# Patient Record
Sex: Male | Born: 1965
Health system: Southern US, Community
[De-identification: ages and names within clinical notes are randomized; demographics above are authoritative.]

## PROBLEM LIST (undated history)

## (undated) DIAGNOSIS — I10 Essential (primary) hypertension: Secondary | ICD-10-CM

---

## 2010-01-04 ENCOUNTER — Encounter: Admission: RE | Admit: 2010-01-04 | Discharge: 2010-01-04 | Payer: Self-pay | Admitting: Neurosurgery

## 2010-02-06 ENCOUNTER — Ambulatory Visit (HOSPITAL_COMMUNITY): Admission: RE | Admit: 2010-02-06 | Discharge: 2010-02-07 | Payer: Self-pay | Admitting: Neurosurgery

## 2011-01-17 LAB — BASIC METABOLIC PANEL
BUN: 6 mg/dL (ref 6–23)
CO2: 25 mEq/L (ref 19–32)
Calcium: 8.7 mg/dL (ref 8.4–10.5)
Creatinine, Ser: 1.06 mg/dL (ref 0.4–1.5)
GFR calc Af Amer: >60 mL/min (ref >60)
GFR calc non Af Amer: >60 mL/min (ref >60)
Glucose, Bld: 88 mg/dL (ref 70–99)
Sodium: 135 mEq/L (ref 135–145)

## 2011-01-17 LAB — CBC
MCV: 91.3 fL (ref 78.0–100.0)
WBC: 9.5 10*3/uL (ref 4.0–10.5)

## 2015-11-04 DIAGNOSIS — K219 Gastro-esophageal reflux disease without esophagitis: Secondary | ICD-10-CM | POA: Diagnosis not present

## 2015-11-04 DIAGNOSIS — Z77090 Contact with and (suspected) exposure to asbestos: Secondary | ICD-10-CM | POA: Diagnosis not present

## 2015-12-15 DIAGNOSIS — M179 Osteoarthritis of knee, unspecified: Secondary | ICD-10-CM | POA: Diagnosis not present

## 2015-12-15 DIAGNOSIS — F419 Anxiety disorder, unspecified: Secondary | ICD-10-CM | POA: Diagnosis not present

## 2015-12-15 DIAGNOSIS — G894 Chronic pain syndrome: Secondary | ICD-10-CM | POA: Diagnosis not present

## 2015-12-15 DIAGNOSIS — R110 Nausea: Secondary | ICD-10-CM | POA: Diagnosis not present

## 2016-02-15 DIAGNOSIS — G894 Chronic pain syndrome: Secondary | ICD-10-CM | POA: Diagnosis not present

## 2016-03-15 DIAGNOSIS — G894 Chronic pain syndrome: Secondary | ICD-10-CM | POA: Diagnosis not present

## 2016-06-26 DIAGNOSIS — G894 Chronic pain syndrome: Secondary | ICD-10-CM | POA: Diagnosis not present

## 2016-06-26 DIAGNOSIS — F419 Anxiety disorder, unspecified: Secondary | ICD-10-CM | POA: Diagnosis not present

## 2016-07-13 DIAGNOSIS — M1711 Unilateral primary osteoarthritis, right knee: Secondary | ICD-10-CM | POA: Diagnosis not present

## 2016-07-25 DIAGNOSIS — F419 Anxiety disorder, unspecified: Secondary | ICD-10-CM | POA: Diagnosis not present

## 2016-09-16 DIAGNOSIS — Z23 Encounter for immunization: Secondary | ICD-10-CM | POA: Diagnosis not present

## 2016-10-01 DIAGNOSIS — G4700 Insomnia, unspecified: Secondary | ICD-10-CM | POA: Diagnosis not present

## 2016-10-01 DIAGNOSIS — G894 Chronic pain syndrome: Secondary | ICD-10-CM | POA: Diagnosis not present

## 2016-10-01 DIAGNOSIS — F419 Anxiety disorder, unspecified: Secondary | ICD-10-CM | POA: Diagnosis not present

## 2016-10-01 DIAGNOSIS — R5382 Chronic fatigue, unspecified: Secondary | ICD-10-CM | POA: Diagnosis not present

## 2016-11-01 DIAGNOSIS — I973 Postprocedural hypertension: Secondary | ICD-10-CM | POA: Diagnosis not present

## 2016-11-01 DIAGNOSIS — F419 Anxiety disorder, unspecified: Secondary | ICD-10-CM | POA: Diagnosis not present

## 2016-11-01 DIAGNOSIS — G894 Chronic pain syndrome: Secondary | ICD-10-CM | POA: Diagnosis not present

## 2016-11-07 DIAGNOSIS — F4321 Adjustment disorder with depressed mood: Secondary | ICD-10-CM | POA: Diagnosis not present

## 2016-11-07 DIAGNOSIS — H6122 Impacted cerumen, left ear: Secondary | ICD-10-CM | POA: Diagnosis not present

## 2016-11-07 DIAGNOSIS — H6692 Otitis media, unspecified, left ear: Secondary | ICD-10-CM | POA: Diagnosis not present

## 2016-11-07 DIAGNOSIS — R5383 Other fatigue: Secondary | ICD-10-CM | POA: Diagnosis not present

## 2016-11-23 DIAGNOSIS — R52 Pain, unspecified: Secondary | ICD-10-CM | POA: Diagnosis not present

## 2016-11-23 DIAGNOSIS — K029 Dental caries, unspecified: Secondary | ICD-10-CM | POA: Diagnosis not present

## 2017-01-01 DIAGNOSIS — M542 Cervicalgia: Secondary | ICD-10-CM | POA: Diagnosis not present

## 2017-01-01 DIAGNOSIS — E6609 Other obesity due to excess calories: Secondary | ICD-10-CM | POA: Diagnosis not present

## 2017-01-01 DIAGNOSIS — F5101 Primary insomnia: Secondary | ICD-10-CM | POA: Diagnosis not present

## 2017-01-01 DIAGNOSIS — F411 Generalized anxiety disorder: Secondary | ICD-10-CM | POA: Diagnosis not present

## 2017-01-09 DIAGNOSIS — M5416 Radiculopathy, lumbar region: Secondary | ICD-10-CM | POA: Diagnosis not present

## 2017-01-09 DIAGNOSIS — M5412 Radiculopathy, cervical region: Secondary | ICD-10-CM | POA: Diagnosis not present

## 2017-01-17 DIAGNOSIS — M1711 Unilateral primary osteoarthritis, right knee: Secondary | ICD-10-CM | POA: Diagnosis not present

## 2017-01-23 DIAGNOSIS — M1711 Unilateral primary osteoarthritis, right knee: Secondary | ICD-10-CM | POA: Diagnosis not present

## 2017-01-23 DIAGNOSIS — M25561 Pain in right knee: Secondary | ICD-10-CM | POA: Diagnosis not present

## 2017-01-23 DIAGNOSIS — G8929 Other chronic pain: Secondary | ICD-10-CM | POA: Diagnosis not present

## 2017-02-20 DIAGNOSIS — M5416 Radiculopathy, lumbar region: Secondary | ICD-10-CM | POA: Diagnosis not present

## 2017-02-20 DIAGNOSIS — G8929 Other chronic pain: Secondary | ICD-10-CM | POA: Diagnosis not present

## 2017-02-20 DIAGNOSIS — M5412 Radiculopathy, cervical region: Secondary | ICD-10-CM | POA: Diagnosis not present

## 2017-02-20 DIAGNOSIS — M25561 Pain in right knee: Secondary | ICD-10-CM | POA: Diagnosis not present

## 2017-03-13 DIAGNOSIS — M1711 Unilateral primary osteoarthritis, right knee: Secondary | ICD-10-CM | POA: Diagnosis not present

## 2017-03-27 DIAGNOSIS — M5412 Radiculopathy, cervical region: Secondary | ICD-10-CM | POA: Diagnosis not present

## 2017-03-27 DIAGNOSIS — G8929 Other chronic pain: Secondary | ICD-10-CM | POA: Diagnosis not present

## 2017-03-27 DIAGNOSIS — M25561 Pain in right knee: Secondary | ICD-10-CM | POA: Diagnosis not present

## 2017-03-27 DIAGNOSIS — M170 Bilateral primary osteoarthritis of knee: Secondary | ICD-10-CM | POA: Diagnosis not present

## 2017-03-27 DIAGNOSIS — M5416 Radiculopathy, lumbar region: Secondary | ICD-10-CM | POA: Diagnosis not present

## 2017-07-08 DIAGNOSIS — K029 Dental caries, unspecified: Secondary | ICD-10-CM | POA: Diagnosis not present

## 2017-07-08 DIAGNOSIS — R52 Pain, unspecified: Secondary | ICD-10-CM | POA: Diagnosis not present

## 2017-08-24 DIAGNOSIS — Z23 Encounter for immunization: Secondary | ICD-10-CM | POA: Diagnosis not present

## 2017-09-03 DIAGNOSIS — I10 Essential (primary) hypertension: Secondary | ICD-10-CM | POA: Diagnosis not present

## 2017-09-03 DIAGNOSIS — E782 Mixed hyperlipidemia: Secondary | ICD-10-CM | POA: Diagnosis not present

## 2017-09-03 DIAGNOSIS — G8929 Other chronic pain: Secondary | ICD-10-CM | POA: Diagnosis not present

## 2017-09-03 DIAGNOSIS — J45998 Other asthma: Secondary | ICD-10-CM | POA: Diagnosis not present

## 2017-09-03 DIAGNOSIS — M542 Cervicalgia: Secondary | ICD-10-CM | POA: Diagnosis not present

## 2017-09-03 DIAGNOSIS — M25561 Pain in right knee: Secondary | ICD-10-CM | POA: Diagnosis not present

## 2017-09-03 DIAGNOSIS — E291 Testicular hypofunction: Secondary | ICD-10-CM | POA: Diagnosis not present

## 2017-09-03 DIAGNOSIS — G43819 Other migraine, intractable, without status migrainosus: Secondary | ICD-10-CM | POA: Diagnosis not present

## 2017-09-24 DIAGNOSIS — M25561 Pain in right knee: Secondary | ICD-10-CM | POA: Diagnosis not present

## 2017-09-24 DIAGNOSIS — I10 Essential (primary) hypertension: Secondary | ICD-10-CM | POA: Diagnosis not present

## 2017-10-25 DIAGNOSIS — Z96651 Presence of right artificial knee joint: Secondary | ICD-10-CM | POA: Diagnosis not present

## 2017-10-25 DIAGNOSIS — I10 Essential (primary) hypertension: Secondary | ICD-10-CM | POA: Diagnosis not present

## 2017-10-25 DIAGNOSIS — S39012A Strain of muscle, fascia and tendon of lower back, initial encounter: Secondary | ICD-10-CM | POA: Diagnosis not present

## 2017-10-25 DIAGNOSIS — G8929 Other chronic pain: Secondary | ICD-10-CM | POA: Diagnosis not present

## 2017-11-01 DIAGNOSIS — M545 Low back pain: Secondary | ICD-10-CM | POA: Diagnosis not present

## 2017-11-14 DIAGNOSIS — M1711 Unilateral primary osteoarthritis, right knee: Secondary | ICD-10-CM | POA: Diagnosis not present

## 2017-11-26 DIAGNOSIS — M79671 Pain in right foot: Secondary | ICD-10-CM | POA: Diagnosis not present

## 2017-11-26 DIAGNOSIS — K029 Dental caries, unspecified: Secondary | ICD-10-CM | POA: Diagnosis not present

## 2017-11-26 DIAGNOSIS — M25774 Osteophyte, right foot: Secondary | ICD-10-CM | POA: Diagnosis not present

## 2017-12-02 DIAGNOSIS — M25561 Pain in right knee: Secondary | ICD-10-CM | POA: Diagnosis not present

## 2017-12-02 DIAGNOSIS — M722 Plantar fascial fibromatosis: Secondary | ICD-10-CM | POA: Diagnosis not present

## 2017-12-23 DIAGNOSIS — M25561 Pain in right knee: Secondary | ICD-10-CM | POA: Diagnosis not present

## 2017-12-26 DIAGNOSIS — M25561 Pain in right knee: Secondary | ICD-10-CM | POA: Diagnosis not present

## 2018-01-22 DIAGNOSIS — R710 Precipitous drop in hematocrit: Secondary | ICD-10-CM | POA: Diagnosis not present

## 2018-01-22 DIAGNOSIS — G43009 Migraine without aura, not intractable, without status migrainosus: Secondary | ICD-10-CM | POA: Diagnosis not present

## 2018-01-22 DIAGNOSIS — R7301 Impaired fasting glucose: Secondary | ICD-10-CM | POA: Diagnosis not present

## 2018-01-22 DIAGNOSIS — M25561 Pain in right knee: Secondary | ICD-10-CM | POA: Diagnosis not present

## 2018-01-22 DIAGNOSIS — I10 Essential (primary) hypertension: Secondary | ICD-10-CM | POA: Diagnosis not present

## 2018-01-22 DIAGNOSIS — Z1211 Encounter for screening for malignant neoplasm of colon: Secondary | ICD-10-CM | POA: Diagnosis not present

## 2018-01-22 DIAGNOSIS — E782 Mixed hyperlipidemia: Secondary | ICD-10-CM | POA: Diagnosis not present

## 2018-01-27 DIAGNOSIS — M6751 Plica syndrome, right knee: Secondary | ICD-10-CM | POA: Diagnosis not present

## 2018-01-27 DIAGNOSIS — M722 Plantar fascial fibromatosis: Secondary | ICD-10-CM | POA: Diagnosis not present

## 2018-01-27 DIAGNOSIS — M23221 Derangement of posterior horn of medial meniscus due to old tear or injury, right knee: Secondary | ICD-10-CM | POA: Diagnosis not present

## 2018-01-27 DIAGNOSIS — M94261 Chondromalacia, right knee: Secondary | ICD-10-CM | POA: Diagnosis not present

## 2018-01-27 DIAGNOSIS — G8918 Other acute postprocedural pain: Secondary | ICD-10-CM | POA: Diagnosis not present

## 2018-02-13 DIAGNOSIS — M25461 Effusion, right knee: Secondary | ICD-10-CM | POA: Diagnosis not present

## 2018-02-27 ENCOUNTER — Encounter: Payer: Self-pay | Admitting: Medical

## 2018-03-06 DIAGNOSIS — M722 Plantar fascial fibromatosis: Secondary | ICD-10-CM | POA: Diagnosis not present

## 2018-04-22 DIAGNOSIS — M1711 Unilateral primary osteoarthritis, right knee: Secondary | ICD-10-CM | POA: Diagnosis not present

## 2018-05-17 DIAGNOSIS — R001 Bradycardia, unspecified: Secondary | ICD-10-CM | POA: Diagnosis not present

## 2018-05-17 DIAGNOSIS — H5789 Other specified disorders of eye and adnexa: Secondary | ICD-10-CM | POA: Diagnosis not present

## 2018-05-17 DIAGNOSIS — H547 Unspecified visual loss: Secondary | ICD-10-CM | POA: Diagnosis not present

## 2018-05-17 DIAGNOSIS — I10 Essential (primary) hypertension: Secondary | ICD-10-CM | POA: Diagnosis not present

## 2018-05-17 DIAGNOSIS — Z79899 Other long term (current) drug therapy: Secondary | ICD-10-CM | POA: Diagnosis not present

## 2018-05-17 DIAGNOSIS — R200 Anesthesia of skin: Secondary | ICD-10-CM | POA: Diagnosis not present

## 2018-05-17 DIAGNOSIS — T675XXA Heat exhaustion, unspecified, initial encounter: Secondary | ICD-10-CM | POA: Diagnosis not present

## 2018-05-17 DIAGNOSIS — R42 Dizziness and giddiness: Secondary | ICD-10-CM | POA: Diagnosis not present

## 2018-05-17 DIAGNOSIS — R51 Headache: Secondary | ICD-10-CM | POA: Diagnosis not present

## 2018-05-19 DIAGNOSIS — I10 Essential (primary) hypertension: Secondary | ICD-10-CM | POA: Diagnosis not present

## 2018-05-19 DIAGNOSIS — G43009 Migraine without aura, not intractable, without status migrainosus: Secondary | ICD-10-CM | POA: Diagnosis not present

## 2018-06-11 DIAGNOSIS — I10 Essential (primary) hypertension: Secondary | ICD-10-CM | POA: Diagnosis not present

## 2018-06-11 DIAGNOSIS — K0889 Other specified disorders of teeth and supporting structures: Secondary | ICD-10-CM | POA: Diagnosis not present

## 2018-07-25 DIAGNOSIS — G43009 Migraine without aura, not intractable, without status migrainosus: Secondary | ICD-10-CM | POA: Diagnosis not present

## 2018-07-25 DIAGNOSIS — E782 Mixed hyperlipidemia: Secondary | ICD-10-CM | POA: Diagnosis not present

## 2018-07-25 DIAGNOSIS — Z23 Encounter for immunization: Secondary | ICD-10-CM | POA: Diagnosis not present

## 2018-07-25 DIAGNOSIS — Z5181 Encounter for therapeutic drug level monitoring: Secondary | ICD-10-CM | POA: Diagnosis not present

## 2018-07-25 DIAGNOSIS — I10 Essential (primary) hypertension: Secondary | ICD-10-CM | POA: Diagnosis not present

## 2018-07-25 DIAGNOSIS — R7303 Prediabetes: Secondary | ICD-10-CM | POA: Diagnosis not present

## 2018-07-25 DIAGNOSIS — Z1211 Encounter for screening for malignant neoplasm of colon: Secondary | ICD-10-CM | POA: Diagnosis not present

## 2018-08-21 DIAGNOSIS — G43909 Migraine, unspecified, not intractable, without status migrainosus: Secondary | ICD-10-CM | POA: Diagnosis not present

## 2018-08-21 DIAGNOSIS — R5382 Chronic fatigue, unspecified: Secondary | ICD-10-CM | POA: Diagnosis not present

## 2018-08-21 DIAGNOSIS — Z1339 Encounter for screening examination for other mental health and behavioral disorders: Secondary | ICD-10-CM | POA: Diagnosis not present

## 2018-08-21 DIAGNOSIS — I10 Essential (primary) hypertension: Secondary | ICD-10-CM | POA: Diagnosis not present

## 2018-08-21 DIAGNOSIS — M159 Polyosteoarthritis, unspecified: Secondary | ICD-10-CM | POA: Diagnosis not present

## 2018-08-21 DIAGNOSIS — E785 Hyperlipidemia, unspecified: Secondary | ICD-10-CM | POA: Diagnosis not present

## 2018-09-05 DIAGNOSIS — I10 Essential (primary) hypertension: Secondary | ICD-10-CM | POA: Diagnosis not present

## 2018-09-05 DIAGNOSIS — M159 Polyosteoarthritis, unspecified: Secondary | ICD-10-CM | POA: Diagnosis not present

## 2018-09-05 DIAGNOSIS — E785 Hyperlipidemia, unspecified: Secondary | ICD-10-CM | POA: Diagnosis not present

## 2018-09-05 DIAGNOSIS — K047 Periapical abscess without sinus: Secondary | ICD-10-CM | POA: Diagnosis not present

## 2018-09-05 DIAGNOSIS — Z1331 Encounter for screening for depression: Secondary | ICD-10-CM | POA: Diagnosis not present

## 2018-09-18 ENCOUNTER — Encounter: Payer: Self-pay | Admitting: Medical

## 2018-09-19 DIAGNOSIS — E785 Hyperlipidemia, unspecified: Secondary | ICD-10-CM | POA: Diagnosis not present

## 2018-09-19 DIAGNOSIS — M159 Polyosteoarthritis, unspecified: Secondary | ICD-10-CM | POA: Diagnosis not present

## 2018-09-19 DIAGNOSIS — I10 Essential (primary) hypertension: Secondary | ICD-10-CM | POA: Diagnosis not present

## 2018-09-19 DIAGNOSIS — G43909 Migraine, unspecified, not intractable, without status migrainosus: Secondary | ICD-10-CM | POA: Diagnosis not present

## 2018-10-24 DIAGNOSIS — J0100 Acute maxillary sinusitis, unspecified: Secondary | ICD-10-CM | POA: Diagnosis not present

## 2018-10-24 DIAGNOSIS — I10 Essential (primary) hypertension: Secondary | ICD-10-CM | POA: Diagnosis not present

## 2018-10-24 DIAGNOSIS — E785 Hyperlipidemia, unspecified: Secondary | ICD-10-CM | POA: Diagnosis not present

## 2018-10-24 DIAGNOSIS — M159 Polyosteoarthritis, unspecified: Secondary | ICD-10-CM | POA: Diagnosis not present

## 2018-11-24 DIAGNOSIS — G43909 Migraine, unspecified, not intractable, without status migrainosus: Secondary | ICD-10-CM | POA: Diagnosis not present

## 2018-11-24 DIAGNOSIS — Z6841 Body Mass Index (BMI) 40.0 and over, adult: Secondary | ICD-10-CM | POA: Diagnosis not present

## 2018-11-24 DIAGNOSIS — E785 Hyperlipidemia, unspecified: Secondary | ICD-10-CM | POA: Diagnosis not present

## 2018-11-24 DIAGNOSIS — K649 Unspecified hemorrhoids: Secondary | ICD-10-CM | POA: Diagnosis not present

## 2018-11-24 DIAGNOSIS — M159 Polyosteoarthritis, unspecified: Secondary | ICD-10-CM | POA: Diagnosis not present

## 2018-11-24 DIAGNOSIS — F5104 Psychophysiologic insomnia: Secondary | ICD-10-CM | POA: Diagnosis not present

## 2018-11-24 DIAGNOSIS — I10 Essential (primary) hypertension: Secondary | ICD-10-CM | POA: Diagnosis not present

## 2018-12-25 DIAGNOSIS — K029 Dental caries, unspecified: Secondary | ICD-10-CM | POA: Diagnosis not present

## 2018-12-25 DIAGNOSIS — K649 Unspecified hemorrhoids: Secondary | ICD-10-CM | POA: Diagnosis not present

## 2018-12-25 DIAGNOSIS — M159 Polyosteoarthritis, unspecified: Secondary | ICD-10-CM | POA: Diagnosis not present

## 2018-12-25 DIAGNOSIS — E669 Obesity, unspecified: Secondary | ICD-10-CM | POA: Diagnosis not present

## 2018-12-25 DIAGNOSIS — Z6841 Body Mass Index (BMI) 40.0 and over, adult: Secondary | ICD-10-CM | POA: Diagnosis not present

## 2018-12-25 DIAGNOSIS — I10 Essential (primary) hypertension: Secondary | ICD-10-CM | POA: Diagnosis not present

## 2018-12-25 DIAGNOSIS — G43909 Migraine, unspecified, not intractable, without status migrainosus: Secondary | ICD-10-CM | POA: Diagnosis not present

## 2018-12-25 DIAGNOSIS — E785 Hyperlipidemia, unspecified: Secondary | ICD-10-CM | POA: Diagnosis not present

## 2018-12-25 DIAGNOSIS — F5104 Psychophysiologic insomnia: Secondary | ICD-10-CM | POA: Diagnosis not present

## 2018-12-25 DIAGNOSIS — Z1211 Encounter for screening for malignant neoplasm of colon: Secondary | ICD-10-CM | POA: Diagnosis not present

## 2019-01-27 DIAGNOSIS — Z6830 Body mass index (BMI) 30.0-30.9, adult: Secondary | ICD-10-CM | POA: Diagnosis not present

## 2019-01-27 DIAGNOSIS — E785 Hyperlipidemia, unspecified: Secondary | ICD-10-CM | POA: Diagnosis not present

## 2019-01-27 DIAGNOSIS — M159 Polyosteoarthritis, unspecified: Secondary | ICD-10-CM | POA: Diagnosis not present

## 2019-01-27 DIAGNOSIS — K029 Dental caries, unspecified: Secondary | ICD-10-CM | POA: Diagnosis not present

## 2019-01-27 DIAGNOSIS — G43909 Migraine, unspecified, not intractable, without status migrainosus: Secondary | ICD-10-CM | POA: Diagnosis not present

## 2019-01-27 DIAGNOSIS — E669 Obesity, unspecified: Secondary | ICD-10-CM | POA: Diagnosis not present

## 2019-01-27 DIAGNOSIS — K649 Unspecified hemorrhoids: Secondary | ICD-10-CM | POA: Diagnosis not present

## 2019-01-27 DIAGNOSIS — I10 Essential (primary) hypertension: Secondary | ICD-10-CM | POA: Diagnosis not present

## 2019-01-27 DIAGNOSIS — F5104 Psychophysiologic insomnia: Secondary | ICD-10-CM | POA: Diagnosis not present

## 2019-02-26 DIAGNOSIS — J309 Allergic rhinitis, unspecified: Secondary | ICD-10-CM | POA: Diagnosis not present

## 2019-02-26 DIAGNOSIS — F5104 Psychophysiologic insomnia: Secondary | ICD-10-CM | POA: Diagnosis not present

## 2019-02-26 DIAGNOSIS — E669 Obesity, unspecified: Secondary | ICD-10-CM | POA: Diagnosis not present

## 2019-02-26 DIAGNOSIS — G43909 Migraine, unspecified, not intractable, without status migrainosus: Secondary | ICD-10-CM | POA: Diagnosis not present

## 2019-02-26 DIAGNOSIS — K649 Unspecified hemorrhoids: Secondary | ICD-10-CM | POA: Diagnosis not present

## 2019-02-26 DIAGNOSIS — I10 Essential (primary) hypertension: Secondary | ICD-10-CM | POA: Diagnosis not present

## 2019-02-26 DIAGNOSIS — E785 Hyperlipidemia, unspecified: Secondary | ICD-10-CM | POA: Diagnosis not present

## 2019-02-26 DIAGNOSIS — Z6830 Body mass index (BMI) 30.0-30.9, adult: Secondary | ICD-10-CM | POA: Diagnosis not present

## 2019-02-26 DIAGNOSIS — M159 Polyosteoarthritis, unspecified: Secondary | ICD-10-CM | POA: Diagnosis not present

## 2019-03-26 DIAGNOSIS — E785 Hyperlipidemia, unspecified: Secondary | ICD-10-CM | POA: Diagnosis not present

## 2019-03-26 DIAGNOSIS — Z6830 Body mass index (BMI) 30.0-30.9, adult: Secondary | ICD-10-CM | POA: Diagnosis not present

## 2019-03-26 DIAGNOSIS — F5104 Psychophysiologic insomnia: Secondary | ICD-10-CM | POA: Diagnosis not present

## 2019-03-26 DIAGNOSIS — K649 Unspecified hemorrhoids: Secondary | ICD-10-CM | POA: Diagnosis not present

## 2019-03-26 DIAGNOSIS — E669 Obesity, unspecified: Secondary | ICD-10-CM | POA: Diagnosis not present

## 2019-03-26 DIAGNOSIS — I10 Essential (primary) hypertension: Secondary | ICD-10-CM | POA: Diagnosis not present

## 2019-03-26 DIAGNOSIS — J309 Allergic rhinitis, unspecified: Secondary | ICD-10-CM | POA: Diagnosis not present

## 2019-03-26 DIAGNOSIS — G43909 Migraine, unspecified, not intractable, without status migrainosus: Secondary | ICD-10-CM | POA: Diagnosis not present

## 2019-03-26 DIAGNOSIS — M159 Polyosteoarthritis, unspecified: Secondary | ICD-10-CM | POA: Diagnosis not present

## 2019-04-20 DIAGNOSIS — E669 Obesity, unspecified: Secondary | ICD-10-CM | POA: Diagnosis not present

## 2019-04-20 DIAGNOSIS — Z6830 Body mass index (BMI) 30.0-30.9, adult: Secondary | ICD-10-CM | POA: Diagnosis not present

## 2019-04-20 DIAGNOSIS — I10 Essential (primary) hypertension: Secondary | ICD-10-CM | POA: Diagnosis not present

## 2019-04-20 DIAGNOSIS — K649 Unspecified hemorrhoids: Secondary | ICD-10-CM | POA: Diagnosis not present

## 2019-04-20 DIAGNOSIS — M159 Polyosteoarthritis, unspecified: Secondary | ICD-10-CM | POA: Diagnosis not present

## 2019-04-20 DIAGNOSIS — E785 Hyperlipidemia, unspecified: Secondary | ICD-10-CM | POA: Diagnosis not present

## 2019-04-20 DIAGNOSIS — J309 Allergic rhinitis, unspecified: Secondary | ICD-10-CM | POA: Diagnosis not present

## 2019-04-20 DIAGNOSIS — K047 Periapical abscess without sinus: Secondary | ICD-10-CM | POA: Diagnosis not present

## 2019-04-20 DIAGNOSIS — G43909 Migraine, unspecified, not intractable, without status migrainosus: Secondary | ICD-10-CM | POA: Diagnosis not present

## 2019-04-20 DIAGNOSIS — F5104 Psychophysiologic insomnia: Secondary | ICD-10-CM | POA: Diagnosis not present

## 2019-04-24 DIAGNOSIS — G43909 Migraine, unspecified, not intractable, without status migrainosus: Secondary | ICD-10-CM | POA: Diagnosis not present

## 2019-04-24 DIAGNOSIS — M159 Polyosteoarthritis, unspecified: Secondary | ICD-10-CM | POA: Diagnosis not present

## 2019-04-24 DIAGNOSIS — K649 Unspecified hemorrhoids: Secondary | ICD-10-CM | POA: Diagnosis not present

## 2019-04-24 DIAGNOSIS — F5104 Psychophysiologic insomnia: Secondary | ICD-10-CM | POA: Diagnosis not present

## 2019-04-24 DIAGNOSIS — Z6829 Body mass index (BMI) 29.0-29.9, adult: Secondary | ICD-10-CM | POA: Diagnosis not present

## 2019-04-24 DIAGNOSIS — E663 Overweight: Secondary | ICD-10-CM | POA: Diagnosis not present

## 2019-04-24 DIAGNOSIS — L309 Dermatitis, unspecified: Secondary | ICD-10-CM | POA: Diagnosis not present

## 2019-04-24 DIAGNOSIS — J309 Allergic rhinitis, unspecified: Secondary | ICD-10-CM | POA: Diagnosis not present

## 2019-05-04 DIAGNOSIS — F5104 Psychophysiologic insomnia: Secondary | ICD-10-CM | POA: Diagnosis not present

## 2019-05-04 DIAGNOSIS — K649 Unspecified hemorrhoids: Secondary | ICD-10-CM | POA: Diagnosis not present

## 2019-05-04 DIAGNOSIS — M159 Polyosteoarthritis, unspecified: Secondary | ICD-10-CM | POA: Diagnosis not present

## 2019-05-04 DIAGNOSIS — E785 Hyperlipidemia, unspecified: Secondary | ICD-10-CM | POA: Diagnosis not present

## 2019-05-04 DIAGNOSIS — I10 Essential (primary) hypertension: Secondary | ICD-10-CM | POA: Diagnosis not present

## 2019-05-04 DIAGNOSIS — Z6830 Body mass index (BMI) 30.0-30.9, adult: Secondary | ICD-10-CM | POA: Diagnosis not present

## 2019-05-04 DIAGNOSIS — J309 Allergic rhinitis, unspecified: Secondary | ICD-10-CM | POA: Diagnosis not present

## 2019-05-04 DIAGNOSIS — G43909 Migraine, unspecified, not intractable, without status migrainosus: Secondary | ICD-10-CM | POA: Diagnosis not present

## 2019-05-04 DIAGNOSIS — E669 Obesity, unspecified: Secondary | ICD-10-CM | POA: Diagnosis not present

## 2019-05-25 DIAGNOSIS — E663 Overweight: Secondary | ICD-10-CM | POA: Diagnosis not present

## 2019-05-25 DIAGNOSIS — G43909 Migraine, unspecified, not intractable, without status migrainosus: Secondary | ICD-10-CM | POA: Diagnosis not present

## 2019-05-25 DIAGNOSIS — F5104 Psychophysiologic insomnia: Secondary | ICD-10-CM | POA: Diagnosis not present

## 2019-05-25 DIAGNOSIS — K649 Unspecified hemorrhoids: Secondary | ICD-10-CM | POA: Diagnosis not present

## 2019-05-25 DIAGNOSIS — L309 Dermatitis, unspecified: Secondary | ICD-10-CM | POA: Diagnosis not present

## 2019-05-25 DIAGNOSIS — M159 Polyosteoarthritis, unspecified: Secondary | ICD-10-CM | POA: Diagnosis not present

## 2019-05-25 DIAGNOSIS — J309 Allergic rhinitis, unspecified: Secondary | ICD-10-CM | POA: Diagnosis not present

## 2019-06-22 DIAGNOSIS — E669 Obesity, unspecified: Secondary | ICD-10-CM | POA: Diagnosis not present

## 2019-06-22 DIAGNOSIS — G43909 Migraine, unspecified, not intractable, without status migrainosus: Secondary | ICD-10-CM | POA: Diagnosis not present

## 2019-06-22 DIAGNOSIS — F5104 Psychophysiologic insomnia: Secondary | ICD-10-CM | POA: Diagnosis not present

## 2019-06-22 DIAGNOSIS — Z6823 Body mass index (BMI) 23.0-23.9, adult: Secondary | ICD-10-CM | POA: Diagnosis not present

## 2019-06-22 DIAGNOSIS — I10 Essential (primary) hypertension: Secondary | ICD-10-CM | POA: Diagnosis not present

## 2019-06-22 DIAGNOSIS — E785 Hyperlipidemia, unspecified: Secondary | ICD-10-CM | POA: Diagnosis not present

## 2019-06-22 DIAGNOSIS — M159 Polyosteoarthritis, unspecified: Secondary | ICD-10-CM | POA: Diagnosis not present

## 2019-06-22 DIAGNOSIS — E663 Overweight: Secondary | ICD-10-CM | POA: Diagnosis not present

## 2019-06-22 DIAGNOSIS — K649 Unspecified hemorrhoids: Secondary | ICD-10-CM | POA: Diagnosis not present

## 2019-06-22 DIAGNOSIS — L309 Dermatitis, unspecified: Secondary | ICD-10-CM | POA: Diagnosis not present

## 2019-06-22 DIAGNOSIS — J309 Allergic rhinitis, unspecified: Secondary | ICD-10-CM | POA: Diagnosis not present

## 2019-07-25 DIAGNOSIS — Z6830 Body mass index (BMI) 30.0-30.9, adult: Secondary | ICD-10-CM | POA: Diagnosis not present

## 2019-07-25 DIAGNOSIS — K219 Gastro-esophageal reflux disease without esophagitis: Secondary | ICD-10-CM | POA: Diagnosis not present

## 2019-07-25 DIAGNOSIS — Z23 Encounter for immunization: Secondary | ICD-10-CM | POA: Diagnosis not present

## 2019-07-25 DIAGNOSIS — F5104 Psychophysiologic insomnia: Secondary | ICD-10-CM | POA: Diagnosis not present

## 2019-07-25 DIAGNOSIS — E669 Obesity, unspecified: Secondary | ICD-10-CM | POA: Diagnosis not present

## 2019-07-25 DIAGNOSIS — K649 Unspecified hemorrhoids: Secondary | ICD-10-CM | POA: Diagnosis not present

## 2019-07-25 DIAGNOSIS — J309 Allergic rhinitis, unspecified: Secondary | ICD-10-CM | POA: Diagnosis not present

## 2019-07-25 DIAGNOSIS — E785 Hyperlipidemia, unspecified: Secondary | ICD-10-CM | POA: Diagnosis not present

## 2019-07-25 DIAGNOSIS — I10 Essential (primary) hypertension: Secondary | ICD-10-CM | POA: Diagnosis not present

## 2019-07-25 DIAGNOSIS — G43909 Migraine, unspecified, not intractable, without status migrainosus: Secondary | ICD-10-CM | POA: Diagnosis not present

## 2019-07-25 DIAGNOSIS — L309 Dermatitis, unspecified: Secondary | ICD-10-CM | POA: Diagnosis not present

## 2019-07-25 DIAGNOSIS — M159 Polyosteoarthritis, unspecified: Secondary | ICD-10-CM | POA: Diagnosis not present

## 2019-08-22 DIAGNOSIS — F5104 Psychophysiologic insomnia: Secondary | ICD-10-CM | POA: Diagnosis not present

## 2019-08-22 DIAGNOSIS — R739 Hyperglycemia, unspecified: Secondary | ICD-10-CM | POA: Diagnosis not present

## 2019-08-22 DIAGNOSIS — Z6830 Body mass index (BMI) 30.0-30.9, adult: Secondary | ICD-10-CM | POA: Diagnosis not present

## 2019-08-22 DIAGNOSIS — J309 Allergic rhinitis, unspecified: Secondary | ICD-10-CM | POA: Diagnosis not present

## 2019-08-22 DIAGNOSIS — G43909 Migraine, unspecified, not intractable, without status migrainosus: Secondary | ICD-10-CM | POA: Diagnosis not present

## 2019-08-22 DIAGNOSIS — K649 Unspecified hemorrhoids: Secondary | ICD-10-CM | POA: Diagnosis not present

## 2019-08-22 DIAGNOSIS — M159 Polyosteoarthritis, unspecified: Secondary | ICD-10-CM | POA: Diagnosis not present

## 2019-08-22 DIAGNOSIS — E669 Obesity, unspecified: Secondary | ICD-10-CM | POA: Diagnosis not present

## 2019-08-22 DIAGNOSIS — L309 Dermatitis, unspecified: Secondary | ICD-10-CM | POA: Diagnosis not present

## 2019-09-19 DIAGNOSIS — G43909 Migraine, unspecified, not intractable, without status migrainosus: Secondary | ICD-10-CM | POA: Diagnosis not present

## 2019-09-19 DIAGNOSIS — M159 Polyosteoarthritis, unspecified: Secondary | ICD-10-CM | POA: Diagnosis not present

## 2019-09-19 DIAGNOSIS — K219 Gastro-esophageal reflux disease without esophagitis: Secondary | ICD-10-CM | POA: Diagnosis not present

## 2019-09-19 DIAGNOSIS — F5104 Psychophysiologic insomnia: Secondary | ICD-10-CM | POA: Diagnosis not present

## 2019-09-19 DIAGNOSIS — L309 Dermatitis, unspecified: Secondary | ICD-10-CM | POA: Diagnosis not present

## 2019-09-19 DIAGNOSIS — E669 Obesity, unspecified: Secondary | ICD-10-CM | POA: Diagnosis not present

## 2019-09-19 DIAGNOSIS — Z6830 Body mass index (BMI) 30.0-30.9, adult: Secondary | ICD-10-CM | POA: Diagnosis not present

## 2019-09-19 DIAGNOSIS — J309 Allergic rhinitis, unspecified: Secondary | ICD-10-CM | POA: Diagnosis not present

## 2019-09-19 DIAGNOSIS — K649 Unspecified hemorrhoids: Secondary | ICD-10-CM | POA: Diagnosis not present

## 2019-10-17 DIAGNOSIS — F5104 Psychophysiologic insomnia: Secondary | ICD-10-CM | POA: Diagnosis not present

## 2019-10-17 DIAGNOSIS — M25512 Pain in left shoulder: Secondary | ICD-10-CM | POA: Diagnosis not present

## 2019-10-17 DIAGNOSIS — E669 Obesity, unspecified: Secondary | ICD-10-CM | POA: Diagnosis not present

## 2019-10-17 DIAGNOSIS — Z6830 Body mass index (BMI) 30.0-30.9, adult: Secondary | ICD-10-CM | POA: Diagnosis not present

## 2019-10-17 DIAGNOSIS — G43909 Migraine, unspecified, not intractable, without status migrainosus: Secondary | ICD-10-CM | POA: Diagnosis not present

## 2019-10-17 DIAGNOSIS — K649 Unspecified hemorrhoids: Secondary | ICD-10-CM | POA: Diagnosis not present

## 2019-10-17 DIAGNOSIS — M159 Polyosteoarthritis, unspecified: Secondary | ICD-10-CM | POA: Diagnosis not present

## 2019-10-17 DIAGNOSIS — J309 Allergic rhinitis, unspecified: Secondary | ICD-10-CM | POA: Diagnosis not present

## 2019-10-17 DIAGNOSIS — E785 Hyperlipidemia, unspecified: Secondary | ICD-10-CM | POA: Diagnosis not present

## 2019-11-14 DIAGNOSIS — L309 Dermatitis, unspecified: Secondary | ICD-10-CM | POA: Diagnosis not present

## 2019-11-14 DIAGNOSIS — E669 Obesity, unspecified: Secondary | ICD-10-CM | POA: Diagnosis not present

## 2019-11-14 DIAGNOSIS — M25512 Pain in left shoulder: Secondary | ICD-10-CM | POA: Diagnosis not present

## 2019-11-14 DIAGNOSIS — M159 Polyosteoarthritis, unspecified: Secondary | ICD-10-CM | POA: Diagnosis not present

## 2019-11-14 DIAGNOSIS — J309 Allergic rhinitis, unspecified: Secondary | ICD-10-CM | POA: Diagnosis not present

## 2019-11-14 DIAGNOSIS — F5104 Psychophysiologic insomnia: Secondary | ICD-10-CM | POA: Diagnosis not present

## 2019-11-14 DIAGNOSIS — E785 Hyperlipidemia, unspecified: Secondary | ICD-10-CM | POA: Diagnosis not present

## 2019-11-14 DIAGNOSIS — Z1331 Encounter for screening for depression: Secondary | ICD-10-CM | POA: Diagnosis not present

## 2019-11-14 DIAGNOSIS — G43909 Migraine, unspecified, not intractable, without status migrainosus: Secondary | ICD-10-CM | POA: Diagnosis not present

## 2019-11-14 DIAGNOSIS — Z6830 Body mass index (BMI) 30.0-30.9, adult: Secondary | ICD-10-CM | POA: Diagnosis not present

## 2019-11-14 DIAGNOSIS — K649 Unspecified hemorrhoids: Secondary | ICD-10-CM | POA: Diagnosis not present

## 2019-11-14 DIAGNOSIS — I10 Essential (primary) hypertension: Secondary | ICD-10-CM | POA: Diagnosis not present

## 2019-11-17 DIAGNOSIS — Z125 Encounter for screening for malignant neoplasm of prostate: Secondary | ICD-10-CM | POA: Diagnosis not present

## 2019-11-17 DIAGNOSIS — Z0000 Encounter for general adult medical examination without abnormal findings: Secondary | ICD-10-CM | POA: Diagnosis not present

## 2019-11-17 DIAGNOSIS — Z9181 History of falling: Secondary | ICD-10-CM | POA: Diagnosis not present

## 2019-11-17 DIAGNOSIS — Z139 Encounter for screening, unspecified: Secondary | ICD-10-CM | POA: Diagnosis not present

## 2019-11-17 DIAGNOSIS — Z1331 Encounter for screening for depression: Secondary | ICD-10-CM | POA: Diagnosis not present

## 2019-11-17 DIAGNOSIS — E785 Hyperlipidemia, unspecified: Secondary | ICD-10-CM | POA: Diagnosis not present

## 2019-11-23 DIAGNOSIS — F5104 Psychophysiologic insomnia: Secondary | ICD-10-CM | POA: Diagnosis not present

## 2019-11-23 DIAGNOSIS — J309 Allergic rhinitis, unspecified: Secondary | ICD-10-CM | POA: Diagnosis not present

## 2019-11-23 DIAGNOSIS — M25551 Pain in right hip: Secondary | ICD-10-CM | POA: Diagnosis not present

## 2019-11-23 DIAGNOSIS — K219 Gastro-esophageal reflux disease without esophagitis: Secondary | ICD-10-CM | POA: Diagnosis not present

## 2019-11-23 DIAGNOSIS — K649 Unspecified hemorrhoids: Secondary | ICD-10-CM | POA: Diagnosis not present

## 2019-11-23 DIAGNOSIS — R739 Hyperglycemia, unspecified: Secondary | ICD-10-CM | POA: Diagnosis not present

## 2019-11-23 DIAGNOSIS — I10 Essential (primary) hypertension: Secondary | ICD-10-CM | POA: Diagnosis not present

## 2019-11-23 DIAGNOSIS — M159 Polyosteoarthritis, unspecified: Secondary | ICD-10-CM | POA: Diagnosis not present

## 2019-11-23 DIAGNOSIS — L309 Dermatitis, unspecified: Secondary | ICD-10-CM | POA: Diagnosis not present

## 2019-11-23 DIAGNOSIS — E785 Hyperlipidemia, unspecified: Secondary | ICD-10-CM | POA: Diagnosis not present

## 2019-11-23 DIAGNOSIS — G43909 Migraine, unspecified, not intractable, without status migrainosus: Secondary | ICD-10-CM | POA: Diagnosis not present

## 2019-11-23 DIAGNOSIS — E669 Obesity, unspecified: Secondary | ICD-10-CM | POA: Diagnosis not present

## 2019-12-07 DIAGNOSIS — E785 Hyperlipidemia, unspecified: Secondary | ICD-10-CM | POA: Diagnosis not present

## 2019-12-07 DIAGNOSIS — G43909 Migraine, unspecified, not intractable, without status migrainosus: Secondary | ICD-10-CM | POA: Diagnosis not present

## 2019-12-07 DIAGNOSIS — E669 Obesity, unspecified: Secondary | ICD-10-CM | POA: Diagnosis not present

## 2019-12-07 DIAGNOSIS — R739 Hyperglycemia, unspecified: Secondary | ICD-10-CM | POA: Diagnosis not present

## 2019-12-07 DIAGNOSIS — Z6829 Body mass index (BMI) 29.0-29.9, adult: Secondary | ICD-10-CM | POA: Diagnosis not present

## 2019-12-07 DIAGNOSIS — L309 Dermatitis, unspecified: Secondary | ICD-10-CM | POA: Diagnosis not present

## 2019-12-07 DIAGNOSIS — J309 Allergic rhinitis, unspecified: Secondary | ICD-10-CM | POA: Diagnosis not present

## 2019-12-07 DIAGNOSIS — K219 Gastro-esophageal reflux disease without esophagitis: Secondary | ICD-10-CM | POA: Diagnosis not present

## 2019-12-07 DIAGNOSIS — I10 Essential (primary) hypertension: Secondary | ICD-10-CM | POA: Diagnosis not present

## 2019-12-07 DIAGNOSIS — K649 Unspecified hemorrhoids: Secondary | ICD-10-CM | POA: Diagnosis not present

## 2019-12-07 DIAGNOSIS — F5104 Psychophysiologic insomnia: Secondary | ICD-10-CM | POA: Diagnosis not present

## 2019-12-07 DIAGNOSIS — M159 Polyosteoarthritis, unspecified: Secondary | ICD-10-CM | POA: Diagnosis not present

## 2019-12-12 DIAGNOSIS — E669 Obesity, unspecified: Secondary | ICD-10-CM | POA: Diagnosis not present

## 2019-12-12 DIAGNOSIS — J309 Allergic rhinitis, unspecified: Secondary | ICD-10-CM | POA: Diagnosis not present

## 2019-12-12 DIAGNOSIS — R739 Hyperglycemia, unspecified: Secondary | ICD-10-CM | POA: Diagnosis not present

## 2019-12-12 DIAGNOSIS — K219 Gastro-esophageal reflux disease without esophagitis: Secondary | ICD-10-CM | POA: Diagnosis not present

## 2019-12-12 DIAGNOSIS — K649 Unspecified hemorrhoids: Secondary | ICD-10-CM | POA: Diagnosis not present

## 2019-12-12 DIAGNOSIS — Z6829 Body mass index (BMI) 29.0-29.9, adult: Secondary | ICD-10-CM | POA: Diagnosis not present

## 2019-12-12 DIAGNOSIS — I10 Essential (primary) hypertension: Secondary | ICD-10-CM | POA: Diagnosis not present

## 2019-12-12 DIAGNOSIS — F5104 Psychophysiologic insomnia: Secondary | ICD-10-CM | POA: Diagnosis not present

## 2019-12-12 DIAGNOSIS — M159 Polyosteoarthritis, unspecified: Secondary | ICD-10-CM | POA: Diagnosis not present

## 2019-12-12 DIAGNOSIS — M25562 Pain in left knee: Secondary | ICD-10-CM | POA: Diagnosis not present

## 2019-12-12 DIAGNOSIS — G43909 Migraine, unspecified, not intractable, without status migrainosus: Secondary | ICD-10-CM | POA: Diagnosis not present

## 2020-01-09 DIAGNOSIS — M159 Polyosteoarthritis, unspecified: Secondary | ICD-10-CM | POA: Diagnosis not present

## 2020-01-09 DIAGNOSIS — I10 Essential (primary) hypertension: Secondary | ICD-10-CM | POA: Diagnosis not present

## 2020-01-09 DIAGNOSIS — K649 Unspecified hemorrhoids: Secondary | ICD-10-CM | POA: Diagnosis not present

## 2020-01-09 DIAGNOSIS — R739 Hyperglycemia, unspecified: Secondary | ICD-10-CM | POA: Diagnosis not present

## 2020-01-09 DIAGNOSIS — K219 Gastro-esophageal reflux disease without esophagitis: Secondary | ICD-10-CM | POA: Diagnosis not present

## 2020-01-09 DIAGNOSIS — L309 Dermatitis, unspecified: Secondary | ICD-10-CM | POA: Diagnosis not present

## 2020-01-09 DIAGNOSIS — E785 Hyperlipidemia, unspecified: Secondary | ICD-10-CM | POA: Diagnosis not present

## 2020-01-09 DIAGNOSIS — F5104 Psychophysiologic insomnia: Secondary | ICD-10-CM | POA: Diagnosis not present

## 2020-01-09 DIAGNOSIS — J309 Allergic rhinitis, unspecified: Secondary | ICD-10-CM | POA: Diagnosis not present

## 2020-01-09 DIAGNOSIS — M25562 Pain in left knee: Secondary | ICD-10-CM | POA: Diagnosis not present

## 2020-01-09 DIAGNOSIS — G43909 Migraine, unspecified, not intractable, without status migrainosus: Secondary | ICD-10-CM | POA: Diagnosis not present

## 2020-01-09 DIAGNOSIS — E669 Obesity, unspecified: Secondary | ICD-10-CM | POA: Diagnosis not present

## 2020-01-15 DIAGNOSIS — E669 Obesity, unspecified: Secondary | ICD-10-CM | POA: Diagnosis not present

## 2020-01-15 DIAGNOSIS — F5104 Psychophysiologic insomnia: Secondary | ICD-10-CM | POA: Diagnosis not present

## 2020-01-15 DIAGNOSIS — L309 Dermatitis, unspecified: Secondary | ICD-10-CM | POA: Diagnosis not present

## 2020-01-15 DIAGNOSIS — G43909 Migraine, unspecified, not intractable, without status migrainosus: Secondary | ICD-10-CM | POA: Diagnosis not present

## 2020-01-15 DIAGNOSIS — J309 Allergic rhinitis, unspecified: Secondary | ICD-10-CM | POA: Diagnosis not present

## 2020-01-15 DIAGNOSIS — K219 Gastro-esophageal reflux disease without esophagitis: Secondary | ICD-10-CM | POA: Diagnosis not present

## 2020-01-15 DIAGNOSIS — M159 Polyosteoarthritis, unspecified: Secondary | ICD-10-CM | POA: Diagnosis not present

## 2020-01-15 DIAGNOSIS — I10 Essential (primary) hypertension: Secondary | ICD-10-CM | POA: Diagnosis not present

## 2020-01-15 DIAGNOSIS — K649 Unspecified hemorrhoids: Secondary | ICD-10-CM | POA: Diagnosis not present

## 2020-01-15 DIAGNOSIS — R739 Hyperglycemia, unspecified: Secondary | ICD-10-CM | POA: Diagnosis not present

## 2020-01-15 DIAGNOSIS — M25561 Pain in right knee: Secondary | ICD-10-CM | POA: Diagnosis not present

## 2020-01-15 DIAGNOSIS — E785 Hyperlipidemia, unspecified: Secondary | ICD-10-CM | POA: Diagnosis not present

## 2020-02-05 DIAGNOSIS — E669 Obesity, unspecified: Secondary | ICD-10-CM | POA: Diagnosis not present

## 2020-02-05 DIAGNOSIS — R739 Hyperglycemia, unspecified: Secondary | ICD-10-CM | POA: Diagnosis not present

## 2020-02-05 DIAGNOSIS — M159 Polyosteoarthritis, unspecified: Secondary | ICD-10-CM | POA: Diagnosis not present

## 2020-02-05 DIAGNOSIS — I10 Essential (primary) hypertension: Secondary | ICD-10-CM | POA: Diagnosis not present

## 2020-02-05 DIAGNOSIS — E785 Hyperlipidemia, unspecified: Secondary | ICD-10-CM | POA: Diagnosis not present

## 2020-02-05 DIAGNOSIS — J309 Allergic rhinitis, unspecified: Secondary | ICD-10-CM | POA: Diagnosis not present

## 2020-02-05 DIAGNOSIS — K219 Gastro-esophageal reflux disease without esophagitis: Secondary | ICD-10-CM | POA: Diagnosis not present

## 2020-02-05 DIAGNOSIS — L309 Dermatitis, unspecified: Secondary | ICD-10-CM | POA: Diagnosis not present

## 2020-02-05 DIAGNOSIS — F5104 Psychophysiologic insomnia: Secondary | ICD-10-CM | POA: Diagnosis not present

## 2020-02-05 DIAGNOSIS — M25551 Pain in right hip: Secondary | ICD-10-CM | POA: Diagnosis not present

## 2020-02-05 DIAGNOSIS — G43909 Migraine, unspecified, not intractable, without status migrainosus: Secondary | ICD-10-CM | POA: Diagnosis not present

## 2020-02-05 DIAGNOSIS — K649 Unspecified hemorrhoids: Secondary | ICD-10-CM | POA: Diagnosis not present

## 2020-03-04 DIAGNOSIS — G43909 Migraine, unspecified, not intractable, without status migrainosus: Secondary | ICD-10-CM | POA: Diagnosis not present

## 2020-03-04 DIAGNOSIS — E669 Obesity, unspecified: Secondary | ICD-10-CM | POA: Diagnosis not present

## 2020-03-04 DIAGNOSIS — L309 Dermatitis, unspecified: Secondary | ICD-10-CM | POA: Diagnosis not present

## 2020-03-04 DIAGNOSIS — E785 Hyperlipidemia, unspecified: Secondary | ICD-10-CM | POA: Diagnosis not present

## 2020-03-04 DIAGNOSIS — F5104 Psychophysiologic insomnia: Secondary | ICD-10-CM | POA: Diagnosis not present

## 2020-03-04 DIAGNOSIS — I10 Essential (primary) hypertension: Secondary | ICD-10-CM | POA: Diagnosis not present

## 2020-03-04 DIAGNOSIS — M159 Polyosteoarthritis, unspecified: Secondary | ICD-10-CM | POA: Diagnosis not present

## 2020-03-04 DIAGNOSIS — R739 Hyperglycemia, unspecified: Secondary | ICD-10-CM | POA: Diagnosis not present

## 2020-03-04 DIAGNOSIS — K219 Gastro-esophageal reflux disease without esophagitis: Secondary | ICD-10-CM | POA: Diagnosis not present

## 2020-03-04 DIAGNOSIS — K649 Unspecified hemorrhoids: Secondary | ICD-10-CM | POA: Diagnosis not present

## 2020-03-04 DIAGNOSIS — J309 Allergic rhinitis, unspecified: Secondary | ICD-10-CM | POA: Diagnosis not present

## 2020-03-04 DIAGNOSIS — E663 Overweight: Secondary | ICD-10-CM | POA: Diagnosis not present

## 2020-04-01 DIAGNOSIS — F5104 Psychophysiologic insomnia: Secondary | ICD-10-CM | POA: Diagnosis not present

## 2020-04-01 DIAGNOSIS — K649 Unspecified hemorrhoids: Secondary | ICD-10-CM | POA: Diagnosis not present

## 2020-04-01 DIAGNOSIS — M159 Polyosteoarthritis, unspecified: Secondary | ICD-10-CM | POA: Diagnosis not present

## 2020-04-01 DIAGNOSIS — I10 Essential (primary) hypertension: Secondary | ICD-10-CM | POA: Diagnosis not present

## 2020-04-01 DIAGNOSIS — G43909 Migraine, unspecified, not intractable, without status migrainosus: Secondary | ICD-10-CM | POA: Diagnosis not present

## 2020-04-01 DIAGNOSIS — L309 Dermatitis, unspecified: Secondary | ICD-10-CM | POA: Diagnosis not present

## 2020-04-01 DIAGNOSIS — M25551 Pain in right hip: Secondary | ICD-10-CM | POA: Diagnosis not present

## 2020-04-01 DIAGNOSIS — J309 Allergic rhinitis, unspecified: Secondary | ICD-10-CM | POA: Diagnosis not present

## 2020-04-01 DIAGNOSIS — E663 Overweight: Secondary | ICD-10-CM | POA: Diagnosis not present

## 2020-04-01 DIAGNOSIS — E785 Hyperlipidemia, unspecified: Secondary | ICD-10-CM | POA: Diagnosis not present

## 2020-04-01 DIAGNOSIS — R739 Hyperglycemia, unspecified: Secondary | ICD-10-CM | POA: Diagnosis not present

## 2020-04-01 DIAGNOSIS — K219 Gastro-esophageal reflux disease without esophagitis: Secondary | ICD-10-CM | POA: Diagnosis not present

## 2020-04-15 DIAGNOSIS — K649 Unspecified hemorrhoids: Secondary | ICD-10-CM | POA: Diagnosis not present

## 2020-04-15 DIAGNOSIS — G43909 Migraine, unspecified, not intractable, without status migrainosus: Secondary | ICD-10-CM | POA: Diagnosis not present

## 2020-04-15 DIAGNOSIS — K219 Gastro-esophageal reflux disease without esophagitis: Secondary | ICD-10-CM | POA: Diagnosis not present

## 2020-04-15 DIAGNOSIS — E663 Overweight: Secondary | ICD-10-CM | POA: Diagnosis not present

## 2020-04-15 DIAGNOSIS — L309 Dermatitis, unspecified: Secondary | ICD-10-CM | POA: Diagnosis not present

## 2020-04-15 DIAGNOSIS — J309 Allergic rhinitis, unspecified: Secondary | ICD-10-CM | POA: Diagnosis not present

## 2020-04-15 DIAGNOSIS — Z6828 Body mass index (BMI) 28.0-28.9, adult: Secondary | ICD-10-CM | POA: Diagnosis not present

## 2020-04-15 DIAGNOSIS — M25551 Pain in right hip: Secondary | ICD-10-CM | POA: Diagnosis not present

## 2020-04-25 DIAGNOSIS — J309 Allergic rhinitis, unspecified: Secondary | ICD-10-CM | POA: Diagnosis not present

## 2020-04-25 DIAGNOSIS — G43909 Migraine, unspecified, not intractable, without status migrainosus: Secondary | ICD-10-CM | POA: Diagnosis not present

## 2020-04-25 DIAGNOSIS — I10 Essential (primary) hypertension: Secondary | ICD-10-CM | POA: Diagnosis not present

## 2020-04-25 DIAGNOSIS — F5104 Psychophysiologic insomnia: Secondary | ICD-10-CM | POA: Diagnosis not present

## 2020-04-25 DIAGNOSIS — M159 Polyosteoarthritis, unspecified: Secondary | ICD-10-CM | POA: Diagnosis not present

## 2020-04-25 DIAGNOSIS — E785 Hyperlipidemia, unspecified: Secondary | ICD-10-CM | POA: Diagnosis not present

## 2020-04-25 DIAGNOSIS — R739 Hyperglycemia, unspecified: Secondary | ICD-10-CM | POA: Diagnosis not present

## 2020-04-25 DIAGNOSIS — K649 Unspecified hemorrhoids: Secondary | ICD-10-CM | POA: Diagnosis not present

## 2020-04-25 DIAGNOSIS — M25551 Pain in right hip: Secondary | ICD-10-CM | POA: Diagnosis not present

## 2020-04-25 DIAGNOSIS — K219 Gastro-esophageal reflux disease without esophagitis: Secondary | ICD-10-CM | POA: Diagnosis not present

## 2020-04-25 DIAGNOSIS — E663 Overweight: Secondary | ICD-10-CM | POA: Diagnosis not present

## 2020-04-25 DIAGNOSIS — L309 Dermatitis, unspecified: Secondary | ICD-10-CM | POA: Diagnosis not present

## 2020-05-20 DIAGNOSIS — I10 Essential (primary) hypertension: Secondary | ICD-10-CM | POA: Diagnosis not present

## 2020-05-20 DIAGNOSIS — E785 Hyperlipidemia, unspecified: Secondary | ICD-10-CM | POA: Diagnosis not present

## 2020-05-20 DIAGNOSIS — E663 Overweight: Secondary | ICD-10-CM | POA: Diagnosis not present

## 2020-05-20 DIAGNOSIS — F5104 Psychophysiologic insomnia: Secondary | ICD-10-CM | POA: Diagnosis not present

## 2020-05-20 DIAGNOSIS — Z6829 Body mass index (BMI) 29.0-29.9, adult: Secondary | ICD-10-CM | POA: Diagnosis not present

## 2020-05-20 DIAGNOSIS — J309 Allergic rhinitis, unspecified: Secondary | ICD-10-CM | POA: Diagnosis not present

## 2020-05-20 DIAGNOSIS — M159 Polyosteoarthritis, unspecified: Secondary | ICD-10-CM | POA: Diagnosis not present

## 2020-05-20 DIAGNOSIS — K219 Gastro-esophageal reflux disease without esophagitis: Secondary | ICD-10-CM | POA: Diagnosis not present

## 2020-05-20 DIAGNOSIS — K649 Unspecified hemorrhoids: Secondary | ICD-10-CM | POA: Diagnosis not present

## 2020-05-20 DIAGNOSIS — R739 Hyperglycemia, unspecified: Secondary | ICD-10-CM | POA: Diagnosis not present

## 2020-05-20 DIAGNOSIS — L309 Dermatitis, unspecified: Secondary | ICD-10-CM | POA: Diagnosis not present

## 2020-05-20 DIAGNOSIS — G43909 Migraine, unspecified, not intractable, without status migrainosus: Secondary | ICD-10-CM | POA: Diagnosis not present

## 2020-06-17 DIAGNOSIS — R739 Hyperglycemia, unspecified: Secondary | ICD-10-CM | POA: Diagnosis not present

## 2020-06-17 DIAGNOSIS — K219 Gastro-esophageal reflux disease without esophagitis: Secondary | ICD-10-CM | POA: Diagnosis not present

## 2020-06-17 DIAGNOSIS — F5104 Psychophysiologic insomnia: Secondary | ICD-10-CM | POA: Diagnosis not present

## 2020-06-17 DIAGNOSIS — I10 Essential (primary) hypertension: Secondary | ICD-10-CM | POA: Diagnosis not present

## 2020-06-17 DIAGNOSIS — Z6829 Body mass index (BMI) 29.0-29.9, adult: Secondary | ICD-10-CM | POA: Diagnosis not present

## 2020-06-17 DIAGNOSIS — G43909 Migraine, unspecified, not intractable, without status migrainosus: Secondary | ICD-10-CM | POA: Diagnosis not present

## 2020-06-17 DIAGNOSIS — E663 Overweight: Secondary | ICD-10-CM | POA: Diagnosis not present

## 2020-06-17 DIAGNOSIS — K649 Unspecified hemorrhoids: Secondary | ICD-10-CM | POA: Diagnosis not present

## 2020-06-17 DIAGNOSIS — M159 Polyosteoarthritis, unspecified: Secondary | ICD-10-CM | POA: Diagnosis not present

## 2020-06-17 DIAGNOSIS — E785 Hyperlipidemia, unspecified: Secondary | ICD-10-CM | POA: Diagnosis not present

## 2020-06-17 DIAGNOSIS — J309 Allergic rhinitis, unspecified: Secondary | ICD-10-CM | POA: Diagnosis not present

## 2020-06-17 DIAGNOSIS — L309 Dermatitis, unspecified: Secondary | ICD-10-CM | POA: Diagnosis not present

## 2020-07-15 DIAGNOSIS — I10 Essential (primary) hypertension: Secondary | ICD-10-CM | POA: Diagnosis not present

## 2020-07-15 DIAGNOSIS — M159 Polyosteoarthritis, unspecified: Secondary | ICD-10-CM | POA: Diagnosis not present

## 2020-07-15 DIAGNOSIS — E663 Overweight: Secondary | ICD-10-CM | POA: Diagnosis not present

## 2020-07-15 DIAGNOSIS — M25512 Pain in left shoulder: Secondary | ICD-10-CM | POA: Diagnosis not present

## 2020-07-15 DIAGNOSIS — G43909 Migraine, unspecified, not intractable, without status migrainosus: Secondary | ICD-10-CM | POA: Diagnosis not present

## 2020-07-15 DIAGNOSIS — E785 Hyperlipidemia, unspecified: Secondary | ICD-10-CM | POA: Diagnosis not present

## 2020-07-15 DIAGNOSIS — F5104 Psychophysiologic insomnia: Secondary | ICD-10-CM | POA: Diagnosis not present

## 2020-07-15 DIAGNOSIS — Z6829 Body mass index (BMI) 29.0-29.9, adult: Secondary | ICD-10-CM | POA: Diagnosis not present

## 2020-07-15 DIAGNOSIS — R739 Hyperglycemia, unspecified: Secondary | ICD-10-CM | POA: Diagnosis not present

## 2020-07-15 DIAGNOSIS — K219 Gastro-esophageal reflux disease without esophagitis: Secondary | ICD-10-CM | POA: Diagnosis not present

## 2020-07-15 DIAGNOSIS — J309 Allergic rhinitis, unspecified: Secondary | ICD-10-CM | POA: Diagnosis not present

## 2020-07-22 DIAGNOSIS — Z6829 Body mass index (BMI) 29.0-29.9, adult: Secondary | ICD-10-CM | POA: Diagnosis not present

## 2020-07-22 DIAGNOSIS — J309 Allergic rhinitis, unspecified: Secondary | ICD-10-CM | POA: Diagnosis not present

## 2020-07-22 DIAGNOSIS — I10 Essential (primary) hypertension: Secondary | ICD-10-CM | POA: Diagnosis not present

## 2020-07-22 DIAGNOSIS — M25512 Pain in left shoulder: Secondary | ICD-10-CM | POA: Diagnosis not present

## 2020-07-22 DIAGNOSIS — K219 Gastro-esophageal reflux disease without esophagitis: Secondary | ICD-10-CM | POA: Diagnosis not present

## 2020-07-22 DIAGNOSIS — Z23 Encounter for immunization: Secondary | ICD-10-CM | POA: Diagnosis not present

## 2020-07-22 DIAGNOSIS — G43909 Migraine, unspecified, not intractable, without status migrainosus: Secondary | ICD-10-CM | POA: Diagnosis not present

## 2020-07-22 DIAGNOSIS — E785 Hyperlipidemia, unspecified: Secondary | ICD-10-CM | POA: Diagnosis not present

## 2020-07-22 DIAGNOSIS — R739 Hyperglycemia, unspecified: Secondary | ICD-10-CM | POA: Diagnosis not present

## 2020-07-22 DIAGNOSIS — E663 Overweight: Secondary | ICD-10-CM | POA: Diagnosis not present

## 2020-08-11 DIAGNOSIS — G43909 Migraine, unspecified, not intractable, without status migrainosus: Secondary | ICD-10-CM | POA: Diagnosis not present

## 2020-08-11 DIAGNOSIS — E118 Type 2 diabetes mellitus with unspecified complications: Secondary | ICD-10-CM | POA: Diagnosis not present

## 2020-08-11 DIAGNOSIS — L309 Dermatitis, unspecified: Secondary | ICD-10-CM | POA: Diagnosis not present

## 2020-08-11 DIAGNOSIS — J309 Allergic rhinitis, unspecified: Secondary | ICD-10-CM | POA: Diagnosis not present

## 2020-08-11 DIAGNOSIS — Z6829 Body mass index (BMI) 29.0-29.9, adult: Secondary | ICD-10-CM | POA: Diagnosis not present

## 2020-08-11 DIAGNOSIS — M159 Polyosteoarthritis, unspecified: Secondary | ICD-10-CM | POA: Diagnosis not present

## 2020-08-11 DIAGNOSIS — F5104 Psychophysiologic insomnia: Secondary | ICD-10-CM | POA: Diagnosis not present

## 2020-08-11 DIAGNOSIS — E663 Overweight: Secondary | ICD-10-CM | POA: Diagnosis not present

## 2020-08-11 DIAGNOSIS — M25561 Pain in right knee: Secondary | ICD-10-CM | POA: Diagnosis not present

## 2020-08-11 DIAGNOSIS — K219 Gastro-esophageal reflux disease without esophagitis: Secondary | ICD-10-CM | POA: Diagnosis not present

## 2020-09-12 DIAGNOSIS — E118 Type 2 diabetes mellitus with unspecified complications: Secondary | ICD-10-CM | POA: Diagnosis not present

## 2020-09-12 DIAGNOSIS — M25561 Pain in right knee: Secondary | ICD-10-CM | POA: Diagnosis not present

## 2020-09-12 DIAGNOSIS — Z6829 Body mass index (BMI) 29.0-29.9, adult: Secondary | ICD-10-CM | POA: Diagnosis not present

## 2020-09-12 DIAGNOSIS — E663 Overweight: Secondary | ICD-10-CM | POA: Diagnosis not present

## 2020-09-12 DIAGNOSIS — J309 Allergic rhinitis, unspecified: Secondary | ICD-10-CM | POA: Diagnosis not present

## 2020-09-12 DIAGNOSIS — M159 Polyosteoarthritis, unspecified: Secondary | ICD-10-CM | POA: Diagnosis not present

## 2020-09-12 DIAGNOSIS — I10 Essential (primary) hypertension: Secondary | ICD-10-CM | POA: Diagnosis not present

## 2020-09-12 DIAGNOSIS — K219 Gastro-esophageal reflux disease without esophagitis: Secondary | ICD-10-CM | POA: Diagnosis not present

## 2020-09-12 DIAGNOSIS — L309 Dermatitis, unspecified: Secondary | ICD-10-CM | POA: Diagnosis not present

## 2020-09-12 DIAGNOSIS — F5104 Psychophysiologic insomnia: Secondary | ICD-10-CM | POA: Diagnosis not present

## 2020-09-12 DIAGNOSIS — E785 Hyperlipidemia, unspecified: Secondary | ICD-10-CM | POA: Diagnosis not present

## 2020-09-12 DIAGNOSIS — G43909 Migraine, unspecified, not intractable, without status migrainosus: Secondary | ICD-10-CM | POA: Diagnosis not present

## 2020-10-10 DIAGNOSIS — K219 Gastro-esophageal reflux disease without esophagitis: Secondary | ICD-10-CM | POA: Diagnosis not present

## 2020-10-10 DIAGNOSIS — I10 Essential (primary) hypertension: Secondary | ICD-10-CM | POA: Diagnosis not present

## 2020-10-10 DIAGNOSIS — M159 Polyosteoarthritis, unspecified: Secondary | ICD-10-CM | POA: Diagnosis not present

## 2020-10-10 DIAGNOSIS — G43909 Migraine, unspecified, not intractable, without status migrainosus: Secondary | ICD-10-CM | POA: Diagnosis not present

## 2020-10-10 DIAGNOSIS — K649 Unspecified hemorrhoids: Secondary | ICD-10-CM | POA: Diagnosis not present

## 2020-10-10 DIAGNOSIS — L309 Dermatitis, unspecified: Secondary | ICD-10-CM | POA: Diagnosis not present

## 2020-10-10 DIAGNOSIS — M25561 Pain in right knee: Secondary | ICD-10-CM | POA: Diagnosis not present

## 2020-10-10 DIAGNOSIS — F5104 Psychophysiologic insomnia: Secondary | ICD-10-CM | POA: Diagnosis not present

## 2020-10-10 DIAGNOSIS — E785 Hyperlipidemia, unspecified: Secondary | ICD-10-CM | POA: Diagnosis not present

## 2020-10-10 DIAGNOSIS — E663 Overweight: Secondary | ICD-10-CM | POA: Diagnosis not present

## 2020-10-10 DIAGNOSIS — J309 Allergic rhinitis, unspecified: Secondary | ICD-10-CM | POA: Diagnosis not present

## 2020-10-10 DIAGNOSIS — E118 Type 2 diabetes mellitus with unspecified complications: Secondary | ICD-10-CM | POA: Diagnosis not present

## 2020-11-07 DIAGNOSIS — J309 Allergic rhinitis, unspecified: Secondary | ICD-10-CM | POA: Diagnosis not present

## 2020-11-07 DIAGNOSIS — M25561 Pain in right knee: Secondary | ICD-10-CM | POA: Diagnosis not present

## 2020-11-07 DIAGNOSIS — F5104 Psychophysiologic insomnia: Secondary | ICD-10-CM | POA: Diagnosis not present

## 2020-11-07 DIAGNOSIS — G43909 Migraine, unspecified, not intractable, without status migrainosus: Secondary | ICD-10-CM | POA: Diagnosis not present

## 2020-11-07 DIAGNOSIS — E663 Overweight: Secondary | ICD-10-CM | POA: Diagnosis not present

## 2020-11-07 DIAGNOSIS — Z1211 Encounter for screening for malignant neoplasm of colon: Secondary | ICD-10-CM | POA: Diagnosis not present

## 2020-11-07 DIAGNOSIS — E785 Hyperlipidemia, unspecified: Secondary | ICD-10-CM | POA: Diagnosis not present

## 2020-11-07 DIAGNOSIS — K219 Gastro-esophageal reflux disease without esophagitis: Secondary | ICD-10-CM | POA: Diagnosis not present

## 2020-11-07 DIAGNOSIS — L309 Dermatitis, unspecified: Secondary | ICD-10-CM | POA: Diagnosis not present

## 2020-11-07 DIAGNOSIS — I10 Essential (primary) hypertension: Secondary | ICD-10-CM | POA: Diagnosis not present

## 2020-11-07 DIAGNOSIS — M159 Polyosteoarthritis, unspecified: Secondary | ICD-10-CM | POA: Diagnosis not present

## 2020-11-07 DIAGNOSIS — E118 Type 2 diabetes mellitus with unspecified complications: Secondary | ICD-10-CM | POA: Diagnosis not present

## 2020-11-17 DIAGNOSIS — I10 Essential (primary) hypertension: Secondary | ICD-10-CM | POA: Diagnosis not present

## 2020-11-17 DIAGNOSIS — F5104 Psychophysiologic insomnia: Secondary | ICD-10-CM | POA: Diagnosis not present

## 2020-11-17 DIAGNOSIS — J309 Allergic rhinitis, unspecified: Secondary | ICD-10-CM | POA: Diagnosis not present

## 2020-11-17 DIAGNOSIS — K219 Gastro-esophageal reflux disease without esophagitis: Secondary | ICD-10-CM | POA: Diagnosis not present

## 2020-11-17 DIAGNOSIS — E785 Hyperlipidemia, unspecified: Secondary | ICD-10-CM | POA: Diagnosis not present

## 2020-11-17 DIAGNOSIS — G43909 Migraine, unspecified, not intractable, without status migrainosus: Secondary | ICD-10-CM | POA: Diagnosis not present

## 2020-11-17 DIAGNOSIS — L309 Dermatitis, unspecified: Secondary | ICD-10-CM | POA: Diagnosis not present

## 2020-11-17 DIAGNOSIS — E663 Overweight: Secondary | ICD-10-CM | POA: Diagnosis not present

## 2020-11-17 DIAGNOSIS — M25511 Pain in right shoulder: Secondary | ICD-10-CM | POA: Diagnosis not present

## 2020-11-17 DIAGNOSIS — E1169 Type 2 diabetes mellitus with other specified complication: Secondary | ICD-10-CM | POA: Diagnosis not present

## 2020-11-17 DIAGNOSIS — M159 Polyosteoarthritis, unspecified: Secondary | ICD-10-CM | POA: Diagnosis not present

## 2020-11-17 DIAGNOSIS — Z6829 Body mass index (BMI) 29.0-29.9, adult: Secondary | ICD-10-CM | POA: Diagnosis not present

## 2020-12-13 DIAGNOSIS — F5104 Psychophysiologic insomnia: Secondary | ICD-10-CM | POA: Diagnosis not present

## 2020-12-13 DIAGNOSIS — E1169 Type 2 diabetes mellitus with other specified complication: Secondary | ICD-10-CM | POA: Diagnosis not present

## 2020-12-13 DIAGNOSIS — Z6829 Body mass index (BMI) 29.0-29.9, adult: Secondary | ICD-10-CM | POA: Diagnosis not present

## 2020-12-13 DIAGNOSIS — E785 Hyperlipidemia, unspecified: Secondary | ICD-10-CM | POA: Diagnosis not present

## 2020-12-13 DIAGNOSIS — M25561 Pain in right knee: Secondary | ICD-10-CM | POA: Diagnosis not present

## 2020-12-13 DIAGNOSIS — M159 Polyosteoarthritis, unspecified: Secondary | ICD-10-CM | POA: Diagnosis not present

## 2020-12-13 DIAGNOSIS — L309 Dermatitis, unspecified: Secondary | ICD-10-CM | POA: Diagnosis not present

## 2020-12-13 DIAGNOSIS — K219 Gastro-esophageal reflux disease without esophagitis: Secondary | ICD-10-CM | POA: Diagnosis not present

## 2020-12-13 DIAGNOSIS — E663 Overweight: Secondary | ICD-10-CM | POA: Diagnosis not present

## 2020-12-13 DIAGNOSIS — J309 Allergic rhinitis, unspecified: Secondary | ICD-10-CM | POA: Diagnosis not present

## 2020-12-13 DIAGNOSIS — G43909 Migraine, unspecified, not intractable, without status migrainosus: Secondary | ICD-10-CM | POA: Diagnosis not present

## 2020-12-13 DIAGNOSIS — I10 Essential (primary) hypertension: Secondary | ICD-10-CM | POA: Diagnosis not present

## 2021-01-11 DIAGNOSIS — M159 Polyosteoarthritis, unspecified: Secondary | ICD-10-CM | POA: Diagnosis not present

## 2021-01-11 DIAGNOSIS — L309 Dermatitis, unspecified: Secondary | ICD-10-CM | POA: Diagnosis not present

## 2021-01-11 DIAGNOSIS — J309 Allergic rhinitis, unspecified: Secondary | ICD-10-CM | POA: Diagnosis not present

## 2021-01-11 DIAGNOSIS — F5104 Psychophysiologic insomnia: Secondary | ICD-10-CM | POA: Diagnosis not present

## 2021-01-11 DIAGNOSIS — Z6829 Body mass index (BMI) 29.0-29.9, adult: Secondary | ICD-10-CM | POA: Diagnosis not present

## 2021-01-11 DIAGNOSIS — E1169 Type 2 diabetes mellitus with other specified complication: Secondary | ICD-10-CM | POA: Diagnosis not present

## 2021-01-11 DIAGNOSIS — I10 Essential (primary) hypertension: Secondary | ICD-10-CM | POA: Diagnosis not present

## 2021-01-11 DIAGNOSIS — K219 Gastro-esophageal reflux disease without esophagitis: Secondary | ICD-10-CM | POA: Diagnosis not present

## 2021-01-11 DIAGNOSIS — G43909 Migraine, unspecified, not intractable, without status migrainosus: Secondary | ICD-10-CM | POA: Diagnosis not present

## 2021-01-11 DIAGNOSIS — E785 Hyperlipidemia, unspecified: Secondary | ICD-10-CM | POA: Diagnosis not present

## 2021-01-11 DIAGNOSIS — E663 Overweight: Secondary | ICD-10-CM | POA: Diagnosis not present

## 2021-01-11 DIAGNOSIS — M25561 Pain in right knee: Secondary | ICD-10-CM | POA: Diagnosis not present

## 2021-02-13 DIAGNOSIS — K649 Unspecified hemorrhoids: Secondary | ICD-10-CM | POA: Diagnosis not present

## 2021-02-13 DIAGNOSIS — E663 Overweight: Secondary | ICD-10-CM | POA: Diagnosis not present

## 2021-02-13 DIAGNOSIS — I10 Essential (primary) hypertension: Secondary | ICD-10-CM | POA: Diagnosis not present

## 2021-02-13 DIAGNOSIS — L309 Dermatitis, unspecified: Secondary | ICD-10-CM | POA: Diagnosis not present

## 2021-02-13 DIAGNOSIS — K219 Gastro-esophageal reflux disease without esophagitis: Secondary | ICD-10-CM | POA: Diagnosis not present

## 2021-02-13 DIAGNOSIS — Z6829 Body mass index (BMI) 29.0-29.9, adult: Secondary | ICD-10-CM | POA: Diagnosis not present

## 2021-02-13 DIAGNOSIS — J309 Allergic rhinitis, unspecified: Secondary | ICD-10-CM | POA: Diagnosis not present

## 2021-02-13 DIAGNOSIS — E1169 Type 2 diabetes mellitus with other specified complication: Secondary | ICD-10-CM | POA: Diagnosis not present

## 2021-02-13 DIAGNOSIS — F5104 Psychophysiologic insomnia: Secondary | ICD-10-CM | POA: Diagnosis not present

## 2021-02-13 DIAGNOSIS — M159 Polyosteoarthritis, unspecified: Secondary | ICD-10-CM | POA: Diagnosis not present

## 2021-02-13 DIAGNOSIS — G43909 Migraine, unspecified, not intractable, without status migrainosus: Secondary | ICD-10-CM | POA: Diagnosis not present

## 2021-02-13 DIAGNOSIS — E785 Hyperlipidemia, unspecified: Secondary | ICD-10-CM | POA: Diagnosis not present

## 2021-02-27 DIAGNOSIS — F5104 Psychophysiologic insomnia: Secondary | ICD-10-CM | POA: Diagnosis not present

## 2021-02-27 DIAGNOSIS — I10 Essential (primary) hypertension: Secondary | ICD-10-CM | POA: Diagnosis not present

## 2021-02-27 DIAGNOSIS — G43909 Migraine, unspecified, not intractable, without status migrainosus: Secondary | ICD-10-CM | POA: Diagnosis not present

## 2021-02-27 DIAGNOSIS — M159 Polyosteoarthritis, unspecified: Secondary | ICD-10-CM | POA: Diagnosis not present

## 2021-02-27 DIAGNOSIS — E785 Hyperlipidemia, unspecified: Secondary | ICD-10-CM | POA: Diagnosis not present

## 2021-02-27 DIAGNOSIS — L309 Dermatitis, unspecified: Secondary | ICD-10-CM | POA: Diagnosis not present

## 2021-02-27 DIAGNOSIS — K219 Gastro-esophageal reflux disease without esophagitis: Secondary | ICD-10-CM | POA: Diagnosis not present

## 2021-02-27 DIAGNOSIS — J309 Allergic rhinitis, unspecified: Secondary | ICD-10-CM | POA: Diagnosis not present

## 2021-02-27 DIAGNOSIS — E291 Testicular hypofunction: Secondary | ICD-10-CM | POA: Diagnosis not present

## 2021-02-27 DIAGNOSIS — E1169 Type 2 diabetes mellitus with other specified complication: Secondary | ICD-10-CM | POA: Diagnosis not present

## 2021-02-27 DIAGNOSIS — E663 Overweight: Secondary | ICD-10-CM | POA: Diagnosis not present

## 2021-02-27 DIAGNOSIS — M25561 Pain in right knee: Secondary | ICD-10-CM | POA: Diagnosis not present

## 2021-03-24 DIAGNOSIS — G43909 Migraine, unspecified, not intractable, without status migrainosus: Secondary | ICD-10-CM | POA: Diagnosis not present

## 2021-03-24 DIAGNOSIS — Z6829 Body mass index (BMI) 29.0-29.9, adult: Secondary | ICD-10-CM | POA: Diagnosis not present

## 2021-03-24 DIAGNOSIS — I10 Essential (primary) hypertension: Secondary | ICD-10-CM | POA: Diagnosis not present

## 2021-03-24 DIAGNOSIS — M159 Polyosteoarthritis, unspecified: Secondary | ICD-10-CM | POA: Diagnosis not present

## 2021-03-24 DIAGNOSIS — J069 Acute upper respiratory infection, unspecified: Secondary | ICD-10-CM | POA: Diagnosis not present

## 2021-03-24 DIAGNOSIS — F5104 Psychophysiologic insomnia: Secondary | ICD-10-CM | POA: Diagnosis not present

## 2021-03-24 DIAGNOSIS — E1169 Type 2 diabetes mellitus with other specified complication: Secondary | ICD-10-CM | POA: Diagnosis not present

## 2021-03-24 DIAGNOSIS — K649 Unspecified hemorrhoids: Secondary | ICD-10-CM | POA: Diagnosis not present

## 2021-03-24 DIAGNOSIS — E785 Hyperlipidemia, unspecified: Secondary | ICD-10-CM | POA: Diagnosis not present

## 2021-03-24 DIAGNOSIS — K219 Gastro-esophageal reflux disease without esophagitis: Secondary | ICD-10-CM | POA: Diagnosis not present

## 2021-03-24 DIAGNOSIS — L309 Dermatitis, unspecified: Secondary | ICD-10-CM | POA: Diagnosis not present

## 2021-03-24 DIAGNOSIS — E663 Overweight: Secondary | ICD-10-CM | POA: Diagnosis not present

## 2021-04-21 DIAGNOSIS — F5104 Psychophysiologic insomnia: Secondary | ICD-10-CM | POA: Diagnosis not present

## 2021-04-21 DIAGNOSIS — M25561 Pain in right knee: Secondary | ICD-10-CM | POA: Diagnosis not present

## 2021-04-21 DIAGNOSIS — K219 Gastro-esophageal reflux disease without esophagitis: Secondary | ICD-10-CM | POA: Diagnosis not present

## 2021-04-21 DIAGNOSIS — E663 Overweight: Secondary | ICD-10-CM | POA: Diagnosis not present

## 2021-04-21 DIAGNOSIS — M159 Polyosteoarthritis, unspecified: Secondary | ICD-10-CM | POA: Diagnosis not present

## 2021-04-21 DIAGNOSIS — E785 Hyperlipidemia, unspecified: Secondary | ICD-10-CM | POA: Diagnosis not present

## 2021-04-21 DIAGNOSIS — G43909 Migraine, unspecified, not intractable, without status migrainosus: Secondary | ICD-10-CM | POA: Diagnosis not present

## 2021-04-21 DIAGNOSIS — Z1331 Encounter for screening for depression: Secondary | ICD-10-CM | POA: Diagnosis not present

## 2021-04-21 DIAGNOSIS — E1169 Type 2 diabetes mellitus with other specified complication: Secondary | ICD-10-CM | POA: Diagnosis not present

## 2021-04-21 DIAGNOSIS — J309 Allergic rhinitis, unspecified: Secondary | ICD-10-CM | POA: Diagnosis not present

## 2021-04-21 DIAGNOSIS — I10 Essential (primary) hypertension: Secondary | ICD-10-CM | POA: Diagnosis not present

## 2021-04-21 DIAGNOSIS — L309 Dermatitis, unspecified: Secondary | ICD-10-CM | POA: Diagnosis not present

## 2021-05-08 ENCOUNTER — Emergency Department (HOSPITAL_COMMUNITY): Payer: Self-pay

## 2021-05-08 ENCOUNTER — Encounter (HOSPITAL_COMMUNITY): Payer: Self-pay | Admitting: Radiology

## 2021-05-08 ENCOUNTER — Emergency Department (HOSPITAL_COMMUNITY)
Admission: EM | Admit: 2021-05-08 | Discharge: 2021-05-08 | Payer: Self-pay | Attending: Emergency Medicine | Admitting: Emergency Medicine

## 2021-05-08 DIAGNOSIS — S82102A Unspecified fracture of upper end of left tibia, initial encounter for closed fracture: Secondary | ICD-10-CM | POA: Diagnosis not present

## 2021-05-08 DIAGNOSIS — Z6833 Body mass index (BMI) 33.0-33.9, adult: Secondary | ICD-10-CM | POA: Diagnosis not present

## 2021-05-08 DIAGNOSIS — T797XXA Traumatic subcutaneous emphysema, initial encounter: Secondary | ICD-10-CM | POA: Diagnosis not present

## 2021-05-08 DIAGNOSIS — T2240XA Corrosion of unspecified degree of shoulder and upper limb, except wrist and hand, unspecified site, initial encounter: Secondary | ICD-10-CM | POA: Diagnosis not present

## 2021-05-08 DIAGNOSIS — D62 Acute posthemorrhagic anemia: Secondary | ICD-10-CM | POA: Diagnosis not present

## 2021-05-08 DIAGNOSIS — S82142C Displaced bicondylar fracture of left tibia, initial encounter for open fracture type IIIA, IIIB, or IIIC: Secondary | ICD-10-CM | POA: Diagnosis not present

## 2021-05-08 DIAGNOSIS — S8292XB Unspecified fracture of left lower leg, initial encounter for open fracture type I or II: Secondary | ICD-10-CM | POA: Diagnosis not present

## 2021-05-08 DIAGNOSIS — S92252B Displaced fracture of navicular [scaphoid] of left foot, initial encounter for open fracture: Secondary | ICD-10-CM | POA: Diagnosis not present

## 2021-05-08 DIAGNOSIS — J9811 Atelectasis: Secondary | ICD-10-CM | POA: Diagnosis not present

## 2021-05-08 DIAGNOSIS — S82042B Displaced comminuted fracture of left patella, initial encounter for open fracture type I or II: Secondary | ICD-10-CM | POA: Diagnosis not present

## 2021-05-08 DIAGNOSIS — T8133XD Disruption of traumatic injury wound repair, subsequent encounter: Secondary | ICD-10-CM | POA: Diagnosis not present

## 2021-05-08 DIAGNOSIS — K567 Ileus, unspecified: Secondary | ICD-10-CM | POA: Diagnosis not present

## 2021-05-08 DIAGNOSIS — I70212 Atherosclerosis of native arteries of extremities with intermittent claudication, left leg: Secondary | ICD-10-CM | POA: Diagnosis not present

## 2021-05-08 DIAGNOSIS — S82402C Unspecified fracture of shaft of left fibula, initial encounter for open fracture type IIIA, IIIB, or IIIC: Secondary | ICD-10-CM | POA: Diagnosis not present

## 2021-05-08 DIAGNOSIS — S82892C Other fracture of left lower leg, initial encounter for open fracture type IIIA, IIIB, or IIIC: Secondary | ICD-10-CM | POA: Diagnosis not present

## 2021-05-08 DIAGNOSIS — S82832B Other fracture of upper and lower end of left fibula, initial encounter for open fracture type I or II: Secondary | ICD-10-CM | POA: Diagnosis not present

## 2021-05-08 DIAGNOSIS — T3121 Burns involving 20-29% of body surface with 10-19% third degree burns: Secondary | ICD-10-CM | POA: Diagnosis not present

## 2021-05-08 DIAGNOSIS — F419 Anxiety disorder, unspecified: Secondary | ICD-10-CM | POA: Diagnosis not present

## 2021-05-08 DIAGNOSIS — T1490XA Injury, unspecified, initial encounter: Secondary | ICD-10-CM

## 2021-05-08 DIAGNOSIS — W369XXA Explosion and rupture of unspecified gas cylinder, initial encounter: Secondary | ICD-10-CM | POA: Diagnosis not present

## 2021-05-08 DIAGNOSIS — S82192B Other fracture of upper end of left tibia, initial encounter for open fracture type I or II: Secondary | ICD-10-CM | POA: Diagnosis not present

## 2021-05-08 DIAGNOSIS — Z4659 Encounter for fitting and adjustment of other gastrointestinal appliance and device: Secondary | ICD-10-CM | POA: Diagnosis not present

## 2021-05-08 DIAGNOSIS — S82002B Unspecified fracture of left patella, initial encounter for open fracture type I or II: Secondary | ICD-10-CM | POA: Diagnosis not present

## 2021-05-08 DIAGNOSIS — Z9889 Other specified postprocedural states: Secondary | ICD-10-CM | POA: Diagnosis not present

## 2021-05-08 DIAGNOSIS — T300 Burn of unspecified body region, unspecified degree: Secondary | ICD-10-CM | POA: Diagnosis not present

## 2021-05-08 DIAGNOSIS — T24002A Burn of unspecified degree of unspecified site of left lower limb, except ankle and foot, initial encounter: Secondary | ICD-10-CM | POA: Diagnosis not present

## 2021-05-08 DIAGNOSIS — S93432A Sprain of tibiofibular ligament of left ankle, initial encounter: Secondary | ICD-10-CM | POA: Diagnosis not present

## 2021-05-08 DIAGNOSIS — T2141XA Corrosion of unspecified degree of chest wall, initial encounter: Secondary | ICD-10-CM | POA: Diagnosis not present

## 2021-05-08 DIAGNOSIS — R0689 Other abnormalities of breathing: Secondary | ICD-10-CM | POA: Diagnosis not present

## 2021-05-08 DIAGNOSIS — Z781 Physical restraint status: Secondary | ICD-10-CM | POA: Diagnosis not present

## 2021-05-08 DIAGNOSIS — E1165 Type 2 diabetes mellitus with hyperglycemia: Secondary | ICD-10-CM | POA: Diagnosis not present

## 2021-05-08 DIAGNOSIS — S82402E Unspecified fracture of shaft of left fibula, subsequent encounter for open fracture type I or II with routine healing: Secondary | ICD-10-CM | POA: Diagnosis not present

## 2021-05-08 DIAGNOSIS — S82392F Other fracture of lower end of left tibia, subsequent encounter for open fracture type IIIA, IIIB, or IIIC with routine healing: Secondary | ICD-10-CM | POA: Diagnosis not present

## 2021-05-08 DIAGNOSIS — K6289 Other specified diseases of anus and rectum: Secondary | ICD-10-CM | POA: Diagnosis not present

## 2021-05-08 DIAGNOSIS — T2122XA Burn of second degree of abdominal wall, initial encounter: Secondary | ICD-10-CM | POA: Diagnosis not present

## 2021-05-08 DIAGNOSIS — T3122 Burns involving 20-29% of body surface with 20-29% third degree burns: Secondary | ICD-10-CM | POA: Diagnosis not present

## 2021-05-08 DIAGNOSIS — E871 Hypo-osmolality and hyponatremia: Secondary | ICD-10-CM | POA: Diagnosis not present

## 2021-05-08 DIAGNOSIS — Y939 Activity, unspecified: Secondary | ICD-10-CM | POA: Diagnosis not present

## 2021-05-08 DIAGNOSIS — I959 Hypotension, unspecified: Secondary | ICD-10-CM | POA: Diagnosis not present

## 2021-05-08 DIAGNOSIS — T3111 Burns involving 10-19% of body surface with 10-19% third degree burns: Secondary | ICD-10-CM | POA: Diagnosis not present

## 2021-05-08 DIAGNOSIS — E1169 Type 2 diabetes mellitus with other specified complication: Secondary | ICD-10-CM | POA: Diagnosis not present

## 2021-05-08 DIAGNOSIS — S82191A Other fracture of upper end of right tibia, initial encounter for closed fracture: Secondary | ICD-10-CM | POA: Diagnosis not present

## 2021-05-08 DIAGNOSIS — S82202B Unspecified fracture of shaft of left tibia, initial encounter for open fracture type I or II: Secondary | ICD-10-CM | POA: Diagnosis not present

## 2021-05-08 DIAGNOSIS — T2069XA Corrosion of second degree of multiple sites of head, face, and neck, initial encounter: Secondary | ICD-10-CM | POA: Diagnosis not present

## 2021-05-08 DIAGNOSIS — N170 Acute kidney failure with tubular necrosis: Secondary | ICD-10-CM | POA: Diagnosis not present

## 2021-05-08 DIAGNOSIS — M1612 Unilateral primary osteoarthritis, left hip: Secondary | ICD-10-CM | POA: Diagnosis not present

## 2021-05-08 DIAGNOSIS — S82402B Unspecified fracture of shaft of left fibula, initial encounter for open fracture type I or II: Secondary | ICD-10-CM | POA: Diagnosis not present

## 2021-05-08 DIAGNOSIS — W401XXA Explosion of explosive gases, initial encounter: Secondary | ICD-10-CM | POA: Insufficient documentation

## 2021-05-08 DIAGNOSIS — L98499 Non-pressure chronic ulcer of skin of other sites with unspecified severity: Secondary | ICD-10-CM | POA: Diagnosis not present

## 2021-05-08 DIAGNOSIS — I9589 Other hypotension: Secondary | ICD-10-CM | POA: Diagnosis not present

## 2021-05-08 DIAGNOSIS — S82041B Displaced comminuted fracture of right patella, initial encounter for open fracture type I or II: Secondary | ICD-10-CM | POA: Diagnosis not present

## 2021-05-08 DIAGNOSIS — T2000XA Burn of unspecified degree of head, face, and neck, unspecified site, initial encounter: Secondary | ICD-10-CM | POA: Diagnosis not present

## 2021-05-08 DIAGNOSIS — S82142A Displaced bicondylar fracture of left tibia, initial encounter for closed fracture: Secondary | ICD-10-CM | POA: Diagnosis not present

## 2021-05-08 DIAGNOSIS — T8133XA Disruption of traumatic injury wound repair, initial encounter: Secondary | ICD-10-CM | POA: Diagnosis not present

## 2021-05-08 DIAGNOSIS — S82041A Displaced comminuted fracture of right patella, initial encounter for closed fracture: Secondary | ICD-10-CM | POA: Diagnosis not present

## 2021-05-08 DIAGNOSIS — R410 Disorientation, unspecified: Secondary | ICD-10-CM | POA: Diagnosis not present

## 2021-05-08 DIAGNOSIS — T2004XA Burn of unspecified degree of nose (septum), initial encounter: Secondary | ICD-10-CM | POA: Diagnosis not present

## 2021-05-08 DIAGNOSIS — Z043 Encounter for examination and observation following other accident: Secondary | ICD-10-CM | POA: Diagnosis not present

## 2021-05-08 DIAGNOSIS — S82202E Unspecified fracture of shaft of left tibia, subsequent encounter for open fracture type I or II with routine healing: Secondary | ICD-10-CM | POA: Diagnosis not present

## 2021-05-08 DIAGNOSIS — T24492A Corrosion of unspecified degree of multiple sites of left lower limb, except ankle and foot, initial encounter: Secondary | ICD-10-CM | POA: Diagnosis not present

## 2021-05-08 DIAGNOSIS — R739 Hyperglycemia, unspecified: Secondary | ICD-10-CM | POA: Diagnosis not present

## 2021-05-08 DIAGNOSIS — T520X1A Toxic effect of petroleum products, accidental (unintentional), initial encounter: Secondary | ICD-10-CM | POA: Diagnosis not present

## 2021-05-08 DIAGNOSIS — T22252A Burn of second degree of left shoulder, initial encounter: Secondary | ICD-10-CM | POA: Diagnosis not present

## 2021-05-08 DIAGNOSIS — Z981 Arthrodesis status: Secondary | ICD-10-CM | POA: Diagnosis not present

## 2021-05-08 DIAGNOSIS — G8911 Acute pain due to trauma: Secondary | ICD-10-CM | POA: Diagnosis not present

## 2021-05-08 DIAGNOSIS — S0501XA Injury of conjunctiva and corneal abrasion without foreign body, right eye, initial encounter: Secondary | ICD-10-CM | POA: Diagnosis not present

## 2021-05-08 DIAGNOSIS — S82192A Other fracture of upper end of left tibia, initial encounter for closed fracture: Secondary | ICD-10-CM | POA: Diagnosis not present

## 2021-05-08 DIAGNOSIS — T2020XA Burn of second degree of head, face, and neck, unspecified site, initial encounter: Secondary | ICD-10-CM | POA: Diagnosis not present

## 2021-05-08 DIAGNOSIS — E8770 Fluid overload, unspecified: Secondary | ICD-10-CM | POA: Diagnosis not present

## 2021-05-08 DIAGNOSIS — T20412A Corrosion of unspecified degree of left ear [any part, except ear drum], initial encounter: Secondary | ICD-10-CM | POA: Diagnosis not present

## 2021-05-08 DIAGNOSIS — S82492F Other fracture of shaft of left fibula, subsequent encounter for open fracture type IIIA, IIIB, or IIIC with routine healing: Secondary | ICD-10-CM | POA: Diagnosis not present

## 2021-05-08 DIAGNOSIS — T3110 Burns involving 10-19% of body surface with 0% to 9% third degree burns: Secondary | ICD-10-CM | POA: Diagnosis not present

## 2021-05-08 DIAGNOSIS — T22232A Burn of second degree of left upper arm, initial encounter: Secondary | ICD-10-CM | POA: Diagnosis not present

## 2021-05-08 DIAGNOSIS — E669 Obesity, unspecified: Secondary | ICD-10-CM | POA: Diagnosis not present

## 2021-05-08 DIAGNOSIS — Z89612 Acquired absence of left leg above knee: Secondary | ICD-10-CM | POA: Diagnosis not present

## 2021-05-08 DIAGNOSIS — T2102XA Burn of unspecified degree of abdominal wall, initial encounter: Secondary | ICD-10-CM | POA: Insufficient documentation

## 2021-05-08 DIAGNOSIS — T3211 Corrosions involving 10-19% of body surface with 10-19% third degree corrosion: Secondary | ICD-10-CM | POA: Diagnosis not present

## 2021-05-08 DIAGNOSIS — J811 Chronic pulmonary edema: Secondary | ICD-10-CM | POA: Diagnosis not present

## 2021-05-08 DIAGNOSIS — S82042A Displaced comminuted fracture of left patella, initial encounter for closed fracture: Secondary | ICD-10-CM | POA: Diagnosis not present

## 2021-05-08 DIAGNOSIS — J9600 Acute respiratory failure, unspecified whether with hypoxia or hypercapnia: Secondary | ICD-10-CM | POA: Diagnosis not present

## 2021-05-08 DIAGNOSIS — G4700 Insomnia, unspecified: Secondary | ICD-10-CM | POA: Diagnosis not present

## 2021-05-08 DIAGNOSIS — R140 Abdominal distension (gaseous): Secondary | ICD-10-CM | POA: Diagnosis not present

## 2021-05-08 DIAGNOSIS — M7989 Other specified soft tissue disorders: Secondary | ICD-10-CM | POA: Diagnosis not present

## 2021-05-08 DIAGNOSIS — S81802A Unspecified open wound, left lower leg, initial encounter: Secondary | ICD-10-CM | POA: Diagnosis not present

## 2021-05-08 DIAGNOSIS — T2130XA Burn of third degree of trunk, unspecified site, initial encounter: Secondary | ICD-10-CM | POA: Insufficient documentation

## 2021-05-08 DIAGNOSIS — T794XXA Traumatic shock, initial encounter: Secondary | ICD-10-CM | POA: Diagnosis not present

## 2021-05-08 DIAGNOSIS — R578 Other shock: Secondary | ICD-10-CM | POA: Diagnosis not present

## 2021-05-08 DIAGNOSIS — T24312A Burn of third degree of left thigh, initial encounter: Secondary | ICD-10-CM | POA: Diagnosis not present

## 2021-05-08 DIAGNOSIS — T24692A Corrosion of second degree of multiple sites of left lower limb, except ankle and foot, initial encounter: Secondary | ICD-10-CM | POA: Diagnosis not present

## 2021-05-08 DIAGNOSIS — S82892B Other fracture of left lower leg, initial encounter for open fracture type I or II: Secondary | ICD-10-CM | POA: Diagnosis not present

## 2021-05-08 DIAGNOSIS — T3210 Corrosions involving 10-19% of body surface with 0% to 9% third degree corrosion: Secondary | ICD-10-CM | POA: Diagnosis not present

## 2021-05-08 DIAGNOSIS — Y929 Unspecified place or not applicable: Secondary | ICD-10-CM | POA: Diagnosis not present

## 2021-05-08 DIAGNOSIS — R000 Tachycardia, unspecified: Secondary | ICD-10-CM | POA: Diagnosis not present

## 2021-05-08 DIAGNOSIS — Y990 Civilian activity done for income or pay: Secondary | ICD-10-CM | POA: Insufficient documentation

## 2021-05-08 DIAGNOSIS — S8012XA Contusion of left lower leg, initial encounter: Secondary | ICD-10-CM | POA: Diagnosis not present

## 2021-05-08 DIAGNOSIS — K5939 Other megacolon: Secondary | ICD-10-CM | POA: Diagnosis not present

## 2021-05-08 DIAGNOSIS — Z9981 Dependence on supplemental oxygen: Secondary | ICD-10-CM | POA: Diagnosis not present

## 2021-05-08 DIAGNOSIS — E8881 Metabolic syndrome: Secondary | ICD-10-CM | POA: Diagnosis not present

## 2021-05-08 DIAGNOSIS — M25552 Pain in left hip: Secondary | ICD-10-CM | POA: Diagnosis not present

## 2021-05-08 DIAGNOSIS — T2142XA Corrosion of unspecified degree of abdominal wall, initial encounter: Secondary | ICD-10-CM | POA: Diagnosis not present

## 2021-05-08 DIAGNOSIS — S8255XA Nondisplaced fracture of medial malleolus of left tibia, initial encounter for closed fracture: Secondary | ICD-10-CM | POA: Diagnosis not present

## 2021-05-08 DIAGNOSIS — T24032A Burn of unspecified degree of left lower leg, initial encounter: Secondary | ICD-10-CM | POA: Diagnosis not present

## 2021-05-08 DIAGNOSIS — S82202C Unspecified fracture of shaft of left tibia, initial encounter for open fracture type IIIA, IIIB, or IIIC: Secondary | ICD-10-CM | POA: Diagnosis not present

## 2021-05-08 DIAGNOSIS — T280XXA Burn of mouth and pharynx, initial encounter: Secondary | ICD-10-CM | POA: Diagnosis not present

## 2021-05-08 DIAGNOSIS — G8918 Other acute postprocedural pain: Secondary | ICD-10-CM | POA: Diagnosis not present

## 2021-05-08 DIAGNOSIS — K6389 Other specified diseases of intestine: Secondary | ICD-10-CM | POA: Diagnosis not present

## 2021-05-08 DIAGNOSIS — Y9389 Activity, other specified: Secondary | ICD-10-CM | POA: Diagnosis not present

## 2021-05-08 DIAGNOSIS — E11628 Type 2 diabetes mellitus with other skin complications: Secondary | ICD-10-CM | POA: Diagnosis not present

## 2021-05-08 DIAGNOSIS — Z87891 Personal history of nicotine dependence: Secondary | ICD-10-CM | POA: Diagnosis not present

## 2021-05-08 DIAGNOSIS — S8252XC Displaced fracture of medial malleolus of left tibia, initial encounter for open fracture type IIIA, IIIB, or IIIC: Secondary | ICD-10-CM | POA: Diagnosis not present

## 2021-05-08 DIAGNOSIS — N179 Acute kidney failure, unspecified: Secondary | ICD-10-CM | POA: Diagnosis not present

## 2021-05-08 DIAGNOSIS — S82252C Displaced comminuted fracture of shaft of left tibia, initial encounter for open fracture type IIIA, IIIB, or IIIC: Secondary | ICD-10-CM | POA: Diagnosis not present

## 2021-05-08 DIAGNOSIS — Z20822 Contact with and (suspected) exposure to covid-19: Secondary | ICD-10-CM | POA: Insufficient documentation

## 2021-05-08 DIAGNOSIS — E874 Mixed disorder of acid-base balance: Secondary | ICD-10-CM | POA: Diagnosis not present

## 2021-05-08 DIAGNOSIS — E872 Acidosis: Secondary | ICD-10-CM | POA: Diagnosis not present

## 2021-05-08 DIAGNOSIS — S0500XA Injury of conjunctiva and corneal abrasion without foreign body, unspecified eye, initial encounter: Secondary | ICD-10-CM | POA: Diagnosis not present

## 2021-05-08 DIAGNOSIS — I10 Essential (primary) hypertension: Secondary | ICD-10-CM | POA: Insufficient documentation

## 2021-05-08 DIAGNOSIS — S92902A Unspecified fracture of left foot, initial encounter for closed fracture: Secondary | ICD-10-CM | POA: Diagnosis not present

## 2021-05-08 DIAGNOSIS — J9690 Respiratory failure, unspecified, unspecified whether with hypoxia or hypercapnia: Secondary | ICD-10-CM | POA: Diagnosis not present

## 2021-05-08 DIAGNOSIS — S82252B Displaced comminuted fracture of shaft of left tibia, initial encounter for open fracture type I or II: Secondary | ICD-10-CM | POA: Diagnosis not present

## 2021-05-08 DIAGNOSIS — S99922A Unspecified injury of left foot, initial encounter: Secondary | ICD-10-CM | POA: Diagnosis not present

## 2021-05-08 DIAGNOSIS — Z89512 Acquired absence of left leg below knee: Secondary | ICD-10-CM | POA: Diagnosis not present

## 2021-05-08 DIAGNOSIS — S82262C Displaced segmental fracture of shaft of left tibia, initial encounter for open fracture type IIIA, IIIB, or IIIC: Secondary | ICD-10-CM | POA: Diagnosis not present

## 2021-05-08 DIAGNOSIS — Y998 Other external cause status: Secondary | ICD-10-CM | POA: Diagnosis not present

## 2021-05-08 DIAGNOSIS — H02203 Unspecified lagophthalmos right eye, unspecified eyelid: Secondary | ICD-10-CM | POA: Diagnosis not present

## 2021-05-08 DIAGNOSIS — T2030XA Burn of third degree of head, face, and neck, unspecified site, initial encounter: Secondary | ICD-10-CM | POA: Insufficient documentation

## 2021-05-08 DIAGNOSIS — Y9289 Other specified places as the place of occurrence of the external cause: Secondary | ICD-10-CM | POA: Insufficient documentation

## 2021-05-08 DIAGNOSIS — S82142B Displaced bicondylar fracture of left tibia, initial encounter for open fracture type I or II: Secondary | ICD-10-CM | POA: Diagnosis not present

## 2021-05-08 DIAGNOSIS — E870 Hyperosmolality and hypernatremia: Secondary | ICD-10-CM | POA: Diagnosis not present

## 2021-05-08 DIAGNOSIS — S82452C Displaced comminuted fracture of shaft of left fibula, initial encounter for open fracture type IIIA, IIIB, or IIIC: Secondary | ICD-10-CM | POA: Diagnosis not present

## 2021-05-08 DIAGNOSIS — T24202A Burn of second degree of unspecified site of left lower limb, except ankle and foot, initial encounter: Secondary | ICD-10-CM | POA: Diagnosis not present

## 2021-05-08 DIAGNOSIS — S82191B Other fracture of upper end of right tibia, initial encounter for open fracture type I or II: Secondary | ICD-10-CM | POA: Diagnosis not present

## 2021-05-08 DIAGNOSIS — S99921A Unspecified injury of right foot, initial encounter: Secondary | ICD-10-CM | POA: Diagnosis not present

## 2021-05-08 DIAGNOSIS — T80818A Extravasation of other vesicant agent, initial encounter: Secondary | ICD-10-CM | POA: Diagnosis not present

## 2021-05-08 DIAGNOSIS — T2220XA Burn of second degree of shoulder and upper limb, except wrist and hand, unspecified site, initial encounter: Secondary | ICD-10-CM | POA: Diagnosis not present

## 2021-05-08 DIAGNOSIS — S82892A Other fracture of left lower leg, initial encounter for closed fracture: Secondary | ICD-10-CM | POA: Diagnosis not present

## 2021-05-08 DIAGNOSIS — Z4682 Encounter for fitting and adjustment of non-vascular catheter: Secondary | ICD-10-CM | POA: Diagnosis not present

## 2021-05-08 DIAGNOSIS — S8252XA Displaced fracture of medial malleolus of left tibia, initial encounter for closed fracture: Secondary | ICD-10-CM | POA: Diagnosis not present

## 2021-05-08 DIAGNOSIS — T2040XA Corrosion of unspecified degree of head, face, and neck, unspecified site, initial encounter: Secondary | ICD-10-CM | POA: Diagnosis not present

## 2021-05-08 DIAGNOSIS — T2006XA Burn of unspecified degree of forehead and cheek, initial encounter: Secondary | ICD-10-CM | POA: Diagnosis not present

## 2021-05-08 DIAGNOSIS — S82002A Unspecified fracture of left patella, initial encounter for closed fracture: Secondary | ICD-10-CM | POA: Diagnosis not present

## 2021-05-08 DIAGNOSIS — T2121XA Burn of second degree of chest wall, initial encounter: Secondary | ICD-10-CM | POA: Diagnosis not present

## 2021-05-08 DIAGNOSIS — I96 Gangrene, not elsewhere classified: Secondary | ICD-10-CM | POA: Diagnosis not present

## 2021-05-08 DIAGNOSIS — Z9911 Dependence on respirator [ventilator] status: Secondary | ICD-10-CM | POA: Diagnosis not present

## 2021-05-08 HISTORY — DX: Essential (primary) hypertension: I10

## 2021-05-08 LAB — I-STAT ARTERIAL BLOOD GAS, ED
Acid-base deficit: 5 mmol/L — ABNORMAL HIGH (ref 0.0–2.0)
Bicarbonate: 20.2 mmol/L (ref 20.0–28.0)
Calcium, Ion: 1.15 mmol/L (ref 1.15–1.40)
HCT: 34 % — ABNORMAL LOW (ref 39.0–52.0)
Hemoglobin: 11.6 g/dL — ABNORMAL LOW (ref 13.0–17.0)
O2 Saturation: 99 %
Patient temperature: 99.2
Potassium: 2.9 mmol/L — ABNORMAL LOW (ref 3.5–5.1)
Sodium: 137 mmol/L (ref 135–145)
TCO2: 21 mmol/L — ABNORMAL LOW (ref 22–32)
pCO2 arterial: 38.9 mmHg (ref 32.0–48.0)
pH, Arterial: 7.325 — ABNORMAL LOW (ref 7.350–7.450)
pO2, Arterial: 160 mmHg — ABNORMAL HIGH (ref 83.0–108.0)

## 2021-05-08 LAB — COMPREHENSIVE METABOLIC PANEL
ALT: 24 U/L (ref 0–44)
AST: 27 U/L (ref 15–41)
Albumin: 3.1 g/dL — ABNORMAL LOW (ref 3.5–5.0)
Alkaline Phosphatase: 50 U/L (ref 38–126)
Anion gap: 14 (ref 5–15)
BUN: 9 mg/dL (ref 6–20)
CO2: 16 mmol/L — ABNORMAL LOW (ref 22–32)
Calcium: 8.2 mg/dL — ABNORMAL LOW (ref 8.9–10.3)
Chloride: 107 mmol/L (ref 98–111)
Creatinine, Ser: 1.52 mg/dL — ABNORMAL HIGH (ref 0.61–1.24)
GFR, Estimated: 54 mL/min — ABNORMAL LOW (ref >60)
Glucose, Bld: 283 mg/dL — ABNORMAL HIGH (ref 70–99)
Potassium: 2.9 mmol/L — ABNORMAL LOW (ref 3.5–5.1)
Sodium: 137 mmol/L (ref 135–145)
Total Bilirubin: 0.7 mg/dL (ref 0.3–1.2)
Total Protein: 6 g/dL — ABNORMAL LOW (ref 6.5–8.1)

## 2021-05-08 LAB — CBC
HCT: 43.2 % (ref 39.0–52.0)
Hemoglobin: 13.9 g/dL (ref 13.0–17.0)
MCH: 31.2 pg (ref 26.0–34.0)
MCHC: 32.2 g/dL (ref 30.0–36.0)
MCV: 96.9 fL (ref 80.0–100.0)
Platelets: 435 10*3/uL — ABNORMAL HIGH (ref 150–400)
RBC: 4.46 MIL/uL (ref 4.22–5.81)
RDW: 12.5 % (ref 11.5–15.5)
WBC: 20.1 10*3/uL — ABNORMAL HIGH (ref 4.0–10.5)
nRBC: 0 % (ref 0.0–0.2)

## 2021-05-08 LAB — I-STAT CHEM 8, ED
BUN: 10 mg/dL (ref 6–20)
Calcium, Ion: 1.02 mmol/L — ABNORMAL LOW (ref 1.15–1.40)
Chloride: 108 mmol/L (ref 98–111)
Creatinine, Ser: 1.3 mg/dL — ABNORMAL HIGH (ref 0.61–1.24)
Glucose, Bld: 263 mg/dL — ABNORMAL HIGH (ref 70–99)
HCT: 41 % (ref 39.0–52.0)
Hemoglobin: 13.9 g/dL (ref 13.0–17.0)
Potassium: 3.2 mmol/L — ABNORMAL LOW (ref 3.5–5.1)
Sodium: 138 mmol/L (ref 135–145)
TCO2: 16 mmol/L — ABNORMAL LOW (ref 22–32)

## 2021-05-08 LAB — URINALYSIS, ROUTINE W REFLEX MICROSCOPIC
Bilirubin Urine: NEGATIVE
Glucose, UA: >=500 mg/dL — AB
Ketones, ur: NEGATIVE mg/dL
Nitrite: NEGATIVE
Protein, ur: NEGATIVE mg/dL
Specific Gravity, Urine: 1.041 — ABNORMAL HIGH (ref 1.005–1.030)
pH: 6 (ref 5.0–8.0)

## 2021-05-08 LAB — LACTIC ACID, PLASMA: Lactic Acid, Venous: 8 mmol/L (ref 0.5–1.9)

## 2021-05-08 LAB — RESP PANEL BY RT-PCR (FLU A&B, COVID) ARPGX2
Influenza A by PCR: NEGATIVE
Influenza B by PCR: NEGATIVE
SARS Coronavirus 2 by RT PCR: NEGATIVE

## 2021-05-08 MED ORDER — FENTANYL 2500MCG IN NS 250ML (10MCG/ML) PREMIX INFUSION
50.0000 ug/h | INTRAVENOUS | Status: DC
  Filled 2021-05-08: qty 250

## 2021-05-08 MED ORDER — TETANUS-DIPHTH-ACELL PERTUSSIS 5-2.5-18.5 LF-MCG/0.5 IM SUSY: 0.5000 mL | PREFILLED_SYRINGE | Freq: Once | INTRAMUSCULAR | Status: DC

## 2021-05-08 MED ORDER — IOHEXOL 350 MG/ML SOLN
100.0000 mL | Freq: Once | INTRAVENOUS | Status: AC | PRN
  Administered 2021-05-08: 100 mL via INTRAVENOUS

## 2021-05-08 MED ORDER — FENTANYL BOLUS VIA INFUSION
50.0000 ug | INTRAVENOUS | Status: DC | PRN
Start: 2021-05-08 — End: 2021-05-08
  Filled 2021-05-08: qty 100

## 2021-05-08 MED ORDER — FENTANYL CITRATE (PF) 100 MCG/2ML IJ SOLN: 50.0000 ug | Freq: Once | INTRAMUSCULAR | Status: DC

## 2021-05-08 MED ORDER — CEFAZOLIN SODIUM-DEXTROSE 1-4 GM/50ML-% IV SOLN: 1.0000 g | Freq: Once | INTRAVENOUS | Status: DC

## 2021-05-08 MED ORDER — PROPOFOL 1000 MG/100ML IV EMUL: 5.0000 ug/kg/min | INTRAVENOUS | Status: DC

## 2021-05-08 MED ORDER — PIPERACILLIN-TAZOBACTAM 3.375 G IVPB 30 MIN
3.3750 g | Freq: Once | INTRAVENOUS | Status: DC
  Filled 2021-05-08: qty 50
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
## 2021-05-08 NOTE — ED Notes (Signed)
Tresa Endo PA redressed left lower leg,  Large amt. Of blood form leg, still able to doppler left pedal pulse.
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
## 2021-05-08 NOTE — Progress Notes (Signed)
Pt transported to CT and back to TRA B without any complications.  
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
## 2021-05-08 NOTE — Progress Notes (Deleted)
   05/08/21 1305  Clinical Encounter Type  Visited With Family  Visit Type Death  Referral From Nurse  Consult/Referral To Chaplain   Chaplain responded to page. The patient is being attended to by the medical team. The patient does not have a support person present. Chaplain remains available. This note was prepared by Deneen Harts, M.Div..  For questions please contact by phone [phone].

## 2021-05-08 NOTE — ED Notes (Signed)
Report called to [facility] to [employer]

## 2021-05-08 NOTE — ED Notes (Signed)
All Belongings given to wife, ring given to wife.  [facility] at bedside for transport.
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
## 2021-05-08 NOTE — Progress Notes (Signed)
   05/08/21 1305  Clinical Encounter Type  Visited With Family  Visit Type Trauma  Referral From Nurse  Consult/Referral To Chaplain   Chaplain responded to page. The patient is being attended to by the medical team. The patient does not have a support person present. Chaplain remains available. This note was prepared by Deneen Harts, M.Div..  For questions please contact by phone [phone].

## 2021-05-09 LAB — BPAM RBC
Blood Product Expiration Date: 202208082359
Blood Product Expiration Date: 202208082359
ISSUE DATE / TIME: 202207111318
ISSUE DATE / TIME: 202207111413
Unit Type and Rh: 5100
Unit Type and Rh: 5100

## 2021-05-09 LAB — TYPE AND SCREEN
ABO/RH(D): O POS
Antibody Screen: NEGATIVE
Unit division: 0
Unit division: 0

## 2021-05-09 LAB — CBG MONITORING, ED: Glucose-Capillary: 324 mg/dL — ABNORMAL HIGH (ref 70–99)

## 2021-05-30 DIAGNOSIS — T22242D Burn of second degree of left axilla, subsequent encounter: Secondary | ICD-10-CM | POA: Diagnosis not present

## 2021-05-30 DIAGNOSIS — T300 Burn of unspecified body region, unspecified degree: Secondary | ICD-10-CM | POA: Diagnosis not present

## 2021-05-30 DIAGNOSIS — R4189 Other symptoms and signs involving cognitive functions and awareness: Secondary | ICD-10-CM | POA: Diagnosis not present

## 2021-05-30 DIAGNOSIS — D62 Acute posthemorrhagic anemia: Secondary | ICD-10-CM | POA: Diagnosis not present

## 2021-05-30 DIAGNOSIS — I10 Essential (primary) hypertension: Secondary | ICD-10-CM | POA: Diagnosis not present

## 2021-05-30 DIAGNOSIS — S82892A Other fracture of left lower leg, initial encounter for closed fracture: Secondary | ICD-10-CM | POA: Diagnosis not present

## 2021-05-30 DIAGNOSIS — Z4781 Encounter for orthopedic aftercare following surgical amputation: Secondary | ICD-10-CM | POA: Diagnosis not present

## 2021-05-30 DIAGNOSIS — T24202D Burn of second degree of unspecified site of left lower limb, except ankle and foot, subsequent encounter: Secondary | ICD-10-CM | POA: Diagnosis not present

## 2021-05-30 DIAGNOSIS — S0501XD Injury of conjunctiva and corneal abrasion without foreign body, right eye, subsequent encounter: Secondary | ICD-10-CM | POA: Diagnosis not present

## 2021-05-30 DIAGNOSIS — T2020XD Burn of second degree of head, face, and neck, unspecified site, subsequent encounter: Secondary | ICD-10-CM | POA: Diagnosis not present

## 2021-05-30 DIAGNOSIS — Z87891 Personal history of nicotine dependence: Secondary | ICD-10-CM | POA: Diagnosis not present

## 2021-05-30 DIAGNOSIS — T3210 Corrosions involving 10-19% of body surface with 0% to 9% third degree corrosion: Secondary | ICD-10-CM | POA: Diagnosis not present

## 2021-05-30 DIAGNOSIS — Y929 Unspecified place or not applicable: Secondary | ICD-10-CM | POA: Diagnosis not present

## 2021-05-30 DIAGNOSIS — N179 Acute kidney failure, unspecified: Secondary | ICD-10-CM | POA: Diagnosis not present

## 2021-05-30 DIAGNOSIS — T2220XD Burn of second degree of shoulder and upper limb, except wrist and hand, unspecified site, subsequent encounter: Secondary | ICD-10-CM | POA: Diagnosis not present

## 2021-05-30 DIAGNOSIS — S0501XA Injury of conjunctiva and corneal abrasion without foreign body, right eye, initial encounter: Secondary | ICD-10-CM | POA: Diagnosis not present

## 2021-05-30 DIAGNOSIS — E669 Obesity, unspecified: Secondary | ICD-10-CM | POA: Diagnosis not present

## 2021-05-30 DIAGNOSIS — Z789 Other specified health status: Secondary | ICD-10-CM | POA: Diagnosis not present

## 2021-05-30 DIAGNOSIS — S82202B Unspecified fracture of shaft of left tibia, initial encounter for open fracture type I or II: Secondary | ICD-10-CM | POA: Diagnosis not present

## 2021-05-30 DIAGNOSIS — S82892C Other fracture of left lower leg, initial encounter for open fracture type IIIA, IIIB, or IIIC: Secondary | ICD-10-CM | POA: Diagnosis not present

## 2021-05-30 DIAGNOSIS — F419 Anxiety disorder, unspecified: Secondary | ICD-10-CM | POA: Diagnosis not present

## 2021-05-30 DIAGNOSIS — E1165 Type 2 diabetes mellitus with hyperglycemia: Secondary | ICD-10-CM | POA: Diagnosis not present

## 2021-05-30 DIAGNOSIS — Z7409 Other reduced mobility: Secondary | ICD-10-CM | POA: Diagnosis not present

## 2021-05-30 DIAGNOSIS — Z6834 Body mass index (BMI) 34.0-34.9, adult: Secondary | ICD-10-CM | POA: Diagnosis not present

## 2021-05-30 DIAGNOSIS — T2124XD Burn of second degree of lower back, subsequent encounter: Secondary | ICD-10-CM | POA: Diagnosis not present

## 2021-05-30 DIAGNOSIS — G4700 Insomnia, unspecified: Secondary | ICD-10-CM | POA: Diagnosis not present

## 2021-05-30 DIAGNOSIS — T2122XD Burn of second degree of abdominal wall, subsequent encounter: Secondary | ICD-10-CM | POA: Diagnosis not present

## 2021-05-30 DIAGNOSIS — K5939 Other megacolon: Secondary | ICD-10-CM | POA: Diagnosis not present

## 2021-05-30 DIAGNOSIS — S92902A Unspecified fracture of left foot, initial encounter for closed fracture: Secondary | ICD-10-CM | POA: Diagnosis not present

## 2021-05-30 DIAGNOSIS — S82002A Unspecified fracture of left patella, initial encounter for closed fracture: Secondary | ICD-10-CM | POA: Diagnosis not present

## 2021-05-30 DIAGNOSIS — E11628 Type 2 diabetes mellitus with other skin complications: Secondary | ICD-10-CM | POA: Diagnosis not present

## 2021-05-30 DIAGNOSIS — Z794 Long term (current) use of insulin: Secondary | ICD-10-CM | POA: Diagnosis not present

## 2021-05-30 DIAGNOSIS — W401XXA Explosion of explosive gases, initial encounter: Secondary | ICD-10-CM | POA: Diagnosis not present

## 2021-05-30 DIAGNOSIS — Z6833 Body mass index (BMI) 33.0-33.9, adult: Secondary | ICD-10-CM | POA: Diagnosis not present

## 2021-05-30 DIAGNOSIS — Z741 Need for assistance with personal care: Secondary | ICD-10-CM | POA: Diagnosis not present

## 2021-05-30 DIAGNOSIS — T3110 Burns involving 10-19% of body surface with 0% to 9% third degree burns: Secondary | ICD-10-CM | POA: Diagnosis not present

## 2021-05-30 DIAGNOSIS — E1169 Type 2 diabetes mellitus with other specified complication: Secondary | ICD-10-CM | POA: Diagnosis not present

## 2021-05-30 DIAGNOSIS — T59891A Toxic effect of other specified gases, fumes and vapors, accidental (unintentional), initial encounter: Secondary | ICD-10-CM | POA: Diagnosis not present

## 2021-05-30 DIAGNOSIS — S82402A Unspecified fracture of shaft of left fibula, initial encounter for closed fracture: Secondary | ICD-10-CM | POA: Diagnosis not present

## 2021-05-30 DIAGNOSIS — D72829 Elevated white blood cell count, unspecified: Secondary | ICD-10-CM | POA: Diagnosis not present

## 2021-05-30 DIAGNOSIS — S82402C Unspecified fracture of shaft of left fibula, initial encounter for open fracture type IIIA, IIIB, or IIIC: Secondary | ICD-10-CM | POA: Diagnosis not present

## 2021-05-30 DIAGNOSIS — T2121XA Burn of second degree of chest wall, initial encounter: Secondary | ICD-10-CM | POA: Diagnosis not present

## 2021-05-30 DIAGNOSIS — S82202C Unspecified fracture of shaft of left tibia, initial encounter for open fracture type IIIA, IIIB, or IIIC: Secondary | ICD-10-CM | POA: Diagnosis not present

## 2021-05-30 DIAGNOSIS — T24212D Burn of second degree of left thigh, subsequent encounter: Secondary | ICD-10-CM | POA: Diagnosis not present

## 2021-05-30 DIAGNOSIS — Z89612 Acquired absence of left leg above knee: Secondary | ICD-10-CM | POA: Diagnosis not present

## 2021-05-31 DIAGNOSIS — E1169 Type 2 diabetes mellitus with other specified complication: Secondary | ICD-10-CM | POA: Diagnosis not present

## 2021-05-31 DIAGNOSIS — E1165 Type 2 diabetes mellitus with hyperglycemia: Secondary | ICD-10-CM | POA: Diagnosis not present

## 2021-05-31 DIAGNOSIS — E669 Obesity, unspecified: Secondary | ICD-10-CM | POA: Diagnosis not present

## 2021-05-31 DIAGNOSIS — Z89612 Acquired absence of left leg above knee: Secondary | ICD-10-CM | POA: Diagnosis not present

## 2021-05-31 DIAGNOSIS — Z6833 Body mass index (BMI) 33.0-33.9, adult: Secondary | ICD-10-CM | POA: Diagnosis not present

## 2021-05-31 DIAGNOSIS — E11628 Type 2 diabetes mellitus with other skin complications: Secondary | ICD-10-CM | POA: Diagnosis not present

## 2021-06-01 DIAGNOSIS — Z6833 Body mass index (BMI) 33.0-33.9, adult: Secondary | ICD-10-CM | POA: Diagnosis not present

## 2021-06-01 DIAGNOSIS — E11628 Type 2 diabetes mellitus with other skin complications: Secondary | ICD-10-CM | POA: Diagnosis not present

## 2021-06-01 DIAGNOSIS — E669 Obesity, unspecified: Secondary | ICD-10-CM | POA: Diagnosis not present

## 2021-06-01 DIAGNOSIS — E1165 Type 2 diabetes mellitus with hyperglycemia: Secondary | ICD-10-CM | POA: Diagnosis not present

## 2021-06-01 DIAGNOSIS — Z89612 Acquired absence of left leg above knee: Secondary | ICD-10-CM | POA: Diagnosis not present

## 2021-06-01 DIAGNOSIS — E1169 Type 2 diabetes mellitus with other specified complication: Secondary | ICD-10-CM | POA: Diagnosis not present

## 2021-06-02 DIAGNOSIS — Z6833 Body mass index (BMI) 33.0-33.9, adult: Secondary | ICD-10-CM | POA: Diagnosis not present

## 2021-06-02 DIAGNOSIS — E1169 Type 2 diabetes mellitus with other specified complication: Secondary | ICD-10-CM | POA: Diagnosis not present

## 2021-06-02 DIAGNOSIS — E1165 Type 2 diabetes mellitus with hyperglycemia: Secondary | ICD-10-CM | POA: Diagnosis not present

## 2021-06-02 DIAGNOSIS — E669 Obesity, unspecified: Secondary | ICD-10-CM | POA: Diagnosis not present

## 2021-06-02 DIAGNOSIS — E11628 Type 2 diabetes mellitus with other skin complications: Secondary | ICD-10-CM | POA: Diagnosis not present

## 2021-06-02 DIAGNOSIS — Z89612 Acquired absence of left leg above knee: Secondary | ICD-10-CM | POA: Diagnosis not present

## 2021-06-16 DIAGNOSIS — E1169 Type 2 diabetes mellitus with other specified complication: Secondary | ICD-10-CM | POA: Diagnosis not present

## 2021-06-16 DIAGNOSIS — D649 Anemia, unspecified: Secondary | ICD-10-CM | POA: Diagnosis not present

## 2021-06-16 DIAGNOSIS — Z89612 Acquired absence of left leg above knee: Secondary | ICD-10-CM | POA: Diagnosis not present

## 2021-06-16 DIAGNOSIS — E785 Hyperlipidemia, unspecified: Secondary | ICD-10-CM | POA: Diagnosis not present

## 2021-06-16 DIAGNOSIS — F5104 Psychophysiologic insomnia: Secondary | ICD-10-CM | POA: Diagnosis not present

## 2021-06-16 DIAGNOSIS — N289 Disorder of kidney and ureter, unspecified: Secondary | ICD-10-CM | POA: Diagnosis not present

## 2021-06-16 DIAGNOSIS — K649 Unspecified hemorrhoids: Secondary | ICD-10-CM | POA: Diagnosis not present

## 2021-06-16 DIAGNOSIS — K219 Gastro-esophageal reflux disease without esophagitis: Secondary | ICD-10-CM | POA: Diagnosis not present

## 2021-06-16 DIAGNOSIS — L309 Dermatitis, unspecified: Secondary | ICD-10-CM | POA: Diagnosis not present

## 2021-06-16 DIAGNOSIS — G43909 Migraine, unspecified, not intractable, without status migrainosus: Secondary | ICD-10-CM | POA: Diagnosis not present

## 2021-06-16 DIAGNOSIS — T300 Burn of unspecified body region, unspecified degree: Secondary | ICD-10-CM | POA: Diagnosis not present

## 2021-06-16 DIAGNOSIS — M159 Polyosteoarthritis, unspecified: Secondary | ICD-10-CM | POA: Diagnosis not present

## 2021-06-19 DIAGNOSIS — T300 Burn of unspecified body region, unspecified degree: Secondary | ICD-10-CM | POA: Diagnosis not present

## 2021-06-19 DIAGNOSIS — T2220XD Burn of second degree of shoulder and upper limb, except wrist and hand, unspecified site, subsequent encounter: Secondary | ICD-10-CM | POA: Diagnosis not present

## 2021-06-19 DIAGNOSIS — Z89612 Acquired absence of left leg above knee: Secondary | ICD-10-CM | POA: Diagnosis not present

## 2021-06-27 DIAGNOSIS — T24202D Burn of second degree of unspecified site of left lower limb, except ankle and foot, subsequent encounter: Secondary | ICD-10-CM | POA: Diagnosis not present

## 2021-06-27 DIAGNOSIS — Z4802 Encounter for removal of sutures: Secondary | ICD-10-CM | POA: Diagnosis not present

## 2021-06-27 DIAGNOSIS — Z4781 Encounter for orthopedic aftercare following surgical amputation: Secondary | ICD-10-CM | POA: Diagnosis not present

## 2021-06-27 DIAGNOSIS — Z89612 Acquired absence of left leg above knee: Secondary | ICD-10-CM | POA: Diagnosis not present

## 2021-06-27 DIAGNOSIS — Z885 Allergy status to narcotic agent status: Secondary | ICD-10-CM | POA: Diagnosis not present

## 2021-06-27 DIAGNOSIS — T3122 Burns involving 20-29% of body surface with 20-29% third degree burns: Secondary | ICD-10-CM | POA: Diagnosis not present

## 2021-06-30 DIAGNOSIS — I10 Essential (primary) hypertension: Secondary | ICD-10-CM | POA: Diagnosis not present

## 2021-06-30 DIAGNOSIS — J309 Allergic rhinitis, unspecified: Secondary | ICD-10-CM | POA: Diagnosis not present

## 2021-06-30 DIAGNOSIS — H9193 Unspecified hearing loss, bilateral: Secondary | ICD-10-CM | POA: Diagnosis not present

## 2021-06-30 DIAGNOSIS — G43909 Migraine, unspecified, not intractable, without status migrainosus: Secondary | ICD-10-CM | POA: Diagnosis not present

## 2021-06-30 DIAGNOSIS — T3110 Burns involving 10-19% of body surface with 0% to 9% third degree burns: Secondary | ICD-10-CM | POA: Diagnosis not present

## 2021-06-30 DIAGNOSIS — M25561 Pain in right knee: Secondary | ICD-10-CM | POA: Diagnosis not present

## 2021-06-30 DIAGNOSIS — E1169 Type 2 diabetes mellitus with other specified complication: Secondary | ICD-10-CM | POA: Diagnosis not present

## 2021-06-30 DIAGNOSIS — D649 Anemia, unspecified: Secondary | ICD-10-CM | POA: Diagnosis not present

## 2021-06-30 DIAGNOSIS — N289 Disorder of kidney and ureter, unspecified: Secondary | ICD-10-CM | POA: Diagnosis not present

## 2021-06-30 DIAGNOSIS — L309 Dermatitis, unspecified: Secondary | ICD-10-CM | POA: Diagnosis not present

## 2021-06-30 DIAGNOSIS — Z89612 Acquired absence of left leg above knee: Secondary | ICD-10-CM | POA: Diagnosis not present

## 2021-06-30 DIAGNOSIS — K219 Gastro-esophageal reflux disease without esophagitis: Secondary | ICD-10-CM | POA: Diagnosis not present

## 2021-06-30 DIAGNOSIS — K649 Unspecified hemorrhoids: Secondary | ICD-10-CM | POA: Diagnosis not present

## 2021-06-30 DIAGNOSIS — E785 Hyperlipidemia, unspecified: Secondary | ICD-10-CM | POA: Diagnosis not present

## 2021-07-05 DIAGNOSIS — Z89612 Acquired absence of left leg above knee: Secondary | ICD-10-CM | POA: Diagnosis not present

## 2021-07-05 DIAGNOSIS — Z885 Allergy status to narcotic agent status: Secondary | ICD-10-CM | POA: Diagnosis not present

## 2021-07-05 DIAGNOSIS — Z4781 Encounter for orthopedic aftercare following surgical amputation: Secondary | ICD-10-CM | POA: Diagnosis not present

## 2021-07-10 DIAGNOSIS — T3110 Burns involving 10-19% of body surface with 0% to 9% third degree burns: Secondary | ICD-10-CM | POA: Diagnosis not present

## 2021-07-10 DIAGNOSIS — Z89612 Acquired absence of left leg above knee: Secondary | ICD-10-CM | POA: Diagnosis not present

## 2021-07-14 DIAGNOSIS — M159 Polyosteoarthritis, unspecified: Secondary | ICD-10-CM | POA: Diagnosis not present

## 2021-07-14 DIAGNOSIS — G894 Chronic pain syndrome: Secondary | ICD-10-CM | POA: Diagnosis not present

## 2021-07-14 DIAGNOSIS — Z89612 Acquired absence of left leg above knee: Secondary | ICD-10-CM | POA: Diagnosis not present

## 2021-07-14 DIAGNOSIS — E1169 Type 2 diabetes mellitus with other specified complication: Secondary | ICD-10-CM | POA: Diagnosis not present

## 2021-07-14 DIAGNOSIS — D649 Anemia, unspecified: Secondary | ICD-10-CM | POA: Diagnosis not present

## 2021-07-14 DIAGNOSIS — Z6829 Body mass index (BMI) 29.0-29.9, adult: Secondary | ICD-10-CM | POA: Diagnosis not present

## 2021-07-14 DIAGNOSIS — K219 Gastro-esophageal reflux disease without esophagitis: Secondary | ICD-10-CM | POA: Diagnosis not present

## 2021-07-14 DIAGNOSIS — E785 Hyperlipidemia, unspecified: Secondary | ICD-10-CM | POA: Diagnosis not present

## 2021-07-14 DIAGNOSIS — N289 Disorder of kidney and ureter, unspecified: Secondary | ICD-10-CM | POA: Diagnosis not present

## 2021-07-14 DIAGNOSIS — G43909 Migraine, unspecified, not intractable, without status migrainosus: Secondary | ICD-10-CM | POA: Diagnosis not present

## 2021-08-09 DIAGNOSIS — T3110 Burns involving 10-19% of body surface with 0% to 9% third degree burns: Secondary | ICD-10-CM | POA: Diagnosis not present

## 2021-08-09 DIAGNOSIS — Z89612 Acquired absence of left leg above knee: Secondary | ICD-10-CM | POA: Diagnosis not present

## 2021-08-11 DIAGNOSIS — N289 Disorder of kidney and ureter, unspecified: Secondary | ICD-10-CM | POA: Diagnosis not present

## 2021-08-11 DIAGNOSIS — F5104 Psychophysiologic insomnia: Secondary | ICD-10-CM | POA: Diagnosis not present

## 2021-08-11 DIAGNOSIS — D649 Anemia, unspecified: Secondary | ICD-10-CM | POA: Diagnosis not present

## 2021-08-11 DIAGNOSIS — K219 Gastro-esophageal reflux disease without esophagitis: Secondary | ICD-10-CM | POA: Diagnosis not present

## 2021-08-11 DIAGNOSIS — L309 Dermatitis, unspecified: Secondary | ICD-10-CM | POA: Diagnosis not present

## 2021-08-11 DIAGNOSIS — G894 Chronic pain syndrome: Secondary | ICD-10-CM | POA: Diagnosis not present

## 2021-08-11 DIAGNOSIS — Z89612 Acquired absence of left leg above knee: Secondary | ICD-10-CM | POA: Diagnosis not present

## 2021-08-11 DIAGNOSIS — M159 Polyosteoarthritis, unspecified: Secondary | ICD-10-CM | POA: Diagnosis not present

## 2021-08-11 DIAGNOSIS — G43909 Migraine, unspecified, not intractable, without status migrainosus: Secondary | ICD-10-CM | POA: Diagnosis not present

## 2021-08-11 DIAGNOSIS — K649 Unspecified hemorrhoids: Secondary | ICD-10-CM | POA: Diagnosis not present

## 2021-08-11 DIAGNOSIS — E1169 Type 2 diabetes mellitus with other specified complication: Secondary | ICD-10-CM | POA: Diagnosis not present

## 2021-08-11 DIAGNOSIS — E785 Hyperlipidemia, unspecified: Secondary | ICD-10-CM | POA: Diagnosis not present

## 2021-08-30 DIAGNOSIS — Z6828 Body mass index (BMI) 28.0-28.9, adult: Secondary | ICD-10-CM | POA: Diagnosis not present

## 2021-08-30 DIAGNOSIS — Z0000 Encounter for general adult medical examination without abnormal findings: Secondary | ICD-10-CM | POA: Diagnosis not present

## 2021-08-30 DIAGNOSIS — Z9181 History of falling: Secondary | ICD-10-CM | POA: Diagnosis not present

## 2021-08-30 DIAGNOSIS — Z1331 Encounter for screening for depression: Secondary | ICD-10-CM | POA: Diagnosis not present

## 2021-08-30 DIAGNOSIS — E669 Obesity, unspecified: Secondary | ICD-10-CM | POA: Diagnosis not present

## 2021-08-30 DIAGNOSIS — E785 Hyperlipidemia, unspecified: Secondary | ICD-10-CM | POA: Diagnosis not present

## 2021-09-08 DIAGNOSIS — E785 Hyperlipidemia, unspecified: Secondary | ICD-10-CM | POA: Diagnosis not present

## 2021-09-08 DIAGNOSIS — G43909 Migraine, unspecified, not intractable, without status migrainosus: Secondary | ICD-10-CM | POA: Diagnosis not present

## 2021-09-08 DIAGNOSIS — K219 Gastro-esophageal reflux disease without esophagitis: Secondary | ICD-10-CM | POA: Diagnosis not present

## 2021-09-08 DIAGNOSIS — D649 Anemia, unspecified: Secondary | ICD-10-CM | POA: Diagnosis not present

## 2021-09-08 DIAGNOSIS — M159 Polyosteoarthritis, unspecified: Secondary | ICD-10-CM | POA: Diagnosis not present

## 2021-09-08 DIAGNOSIS — L309 Dermatitis, unspecified: Secondary | ICD-10-CM | POA: Diagnosis not present

## 2021-09-08 DIAGNOSIS — E1169 Type 2 diabetes mellitus with other specified complication: Secondary | ICD-10-CM | POA: Diagnosis not present

## 2021-09-08 DIAGNOSIS — F5104 Psychophysiologic insomnia: Secondary | ICD-10-CM | POA: Diagnosis not present

## 2021-09-08 DIAGNOSIS — N289 Disorder of kidney and ureter, unspecified: Secondary | ICD-10-CM | POA: Diagnosis not present

## 2021-09-08 DIAGNOSIS — Z89612 Acquired absence of left leg above knee: Secondary | ICD-10-CM | POA: Diagnosis not present

## 2021-09-08 DIAGNOSIS — G894 Chronic pain syndrome: Secondary | ICD-10-CM | POA: Diagnosis not present

## 2021-09-08 DIAGNOSIS — K649 Unspecified hemorrhoids: Secondary | ICD-10-CM | POA: Diagnosis not present

## 2021-09-09 DIAGNOSIS — Z89612 Acquired absence of left leg above knee: Secondary | ICD-10-CM | POA: Diagnosis not present

## 2021-09-09 DIAGNOSIS — T3110 Burns involving 10-19% of body surface with 0% to 9% third degree burns: Secondary | ICD-10-CM | POA: Diagnosis not present

## 2021-10-02 DIAGNOSIS — Z7409 Other reduced mobility: Secondary | ICD-10-CM | POA: Diagnosis not present

## 2021-10-02 DIAGNOSIS — Z89612 Acquired absence of left leg above knee: Secondary | ICD-10-CM | POA: Diagnosis not present

## 2021-10-02 DIAGNOSIS — R29898 Other symptoms and signs involving the musculoskeletal system: Secondary | ICD-10-CM | POA: Diagnosis not present

## 2021-10-02 DIAGNOSIS — T300 Burn of unspecified body region, unspecified degree: Secondary | ICD-10-CM | POA: Diagnosis not present

## 2021-10-04 DIAGNOSIS — Z89612 Acquired absence of left leg above knee: Secondary | ICD-10-CM | POA: Diagnosis not present

## 2021-10-04 DIAGNOSIS — Z471 Aftercare following joint replacement surgery: Secondary | ICD-10-CM | POA: Diagnosis not present

## 2021-10-06 DIAGNOSIS — D649 Anemia, unspecified: Secondary | ICD-10-CM | POA: Diagnosis not present

## 2021-10-06 DIAGNOSIS — G894 Chronic pain syndrome: Secondary | ICD-10-CM | POA: Diagnosis not present

## 2021-10-06 DIAGNOSIS — K219 Gastro-esophageal reflux disease without esophagitis: Secondary | ICD-10-CM | POA: Diagnosis not present

## 2021-10-06 DIAGNOSIS — G43909 Migraine, unspecified, not intractable, without status migrainosus: Secondary | ICD-10-CM | POA: Diagnosis not present

## 2021-10-06 DIAGNOSIS — E1169 Type 2 diabetes mellitus with other specified complication: Secondary | ICD-10-CM | POA: Diagnosis not present

## 2021-10-06 DIAGNOSIS — M159 Polyosteoarthritis, unspecified: Secondary | ICD-10-CM | POA: Diagnosis not present

## 2021-10-06 DIAGNOSIS — L309 Dermatitis, unspecified: Secondary | ICD-10-CM | POA: Diagnosis not present

## 2021-10-06 DIAGNOSIS — K649 Unspecified hemorrhoids: Secondary | ICD-10-CM | POA: Diagnosis not present

## 2021-10-06 DIAGNOSIS — Z89612 Acquired absence of left leg above knee: Secondary | ICD-10-CM | POA: Diagnosis not present

## 2021-10-06 DIAGNOSIS — N289 Disorder of kidney and ureter, unspecified: Secondary | ICD-10-CM | POA: Diagnosis not present

## 2021-10-06 DIAGNOSIS — F5104 Psychophysiologic insomnia: Secondary | ICD-10-CM | POA: Diagnosis not present

## 2021-10-06 DIAGNOSIS — E785 Hyperlipidemia, unspecified: Secondary | ICD-10-CM | POA: Diagnosis not present

## 2021-10-09 DIAGNOSIS — T3110 Burns involving 10-19% of body surface with 0% to 9% third degree burns: Secondary | ICD-10-CM | POA: Diagnosis not present

## 2021-10-09 DIAGNOSIS — Z89612 Acquired absence of left leg above knee: Secondary | ICD-10-CM | POA: Diagnosis not present

## 2021-10-27 DIAGNOSIS — Z89612 Acquired absence of left leg above knee: Secondary | ICD-10-CM | POA: Diagnosis not present

## 2021-10-31 DIAGNOSIS — Z6829 Body mass index (BMI) 29.0-29.9, adult: Secondary | ICD-10-CM | POA: Diagnosis not present

## 2021-10-31 DIAGNOSIS — I10 Essential (primary) hypertension: Secondary | ICD-10-CM | POA: Diagnosis not present

## 2021-10-31 DIAGNOSIS — L0291 Cutaneous abscess, unspecified: Secondary | ICD-10-CM | POA: Diagnosis not present

## 2021-10-31 DIAGNOSIS — K219 Gastro-esophageal reflux disease without esophagitis: Secondary | ICD-10-CM | POA: Diagnosis not present

## 2021-11-03 DIAGNOSIS — K219 Gastro-esophageal reflux disease without esophagitis: Secondary | ICD-10-CM | POA: Diagnosis not present

## 2021-11-03 DIAGNOSIS — Z89612 Acquired absence of left leg above knee: Secondary | ICD-10-CM | POA: Diagnosis not present

## 2021-11-03 DIAGNOSIS — M159 Polyosteoarthritis, unspecified: Secondary | ICD-10-CM | POA: Diagnosis not present

## 2021-11-03 DIAGNOSIS — E1169 Type 2 diabetes mellitus with other specified complication: Secondary | ICD-10-CM | POA: Diagnosis not present

## 2021-11-03 DIAGNOSIS — G43909 Migraine, unspecified, not intractable, without status migrainosus: Secondary | ICD-10-CM | POA: Diagnosis not present

## 2021-11-03 DIAGNOSIS — F5104 Psychophysiologic insomnia: Secondary | ICD-10-CM | POA: Diagnosis not present

## 2021-11-03 DIAGNOSIS — E785 Hyperlipidemia, unspecified: Secondary | ICD-10-CM | POA: Diagnosis not present

## 2021-11-03 DIAGNOSIS — G894 Chronic pain syndrome: Secondary | ICD-10-CM | POA: Diagnosis not present

## 2021-11-03 DIAGNOSIS — K649 Unspecified hemorrhoids: Secondary | ICD-10-CM | POA: Diagnosis not present

## 2021-11-03 DIAGNOSIS — N289 Disorder of kidney and ureter, unspecified: Secondary | ICD-10-CM | POA: Diagnosis not present

## 2021-11-03 DIAGNOSIS — L309 Dermatitis, unspecified: Secondary | ICD-10-CM | POA: Diagnosis not present

## 2021-11-03 DIAGNOSIS — D649 Anemia, unspecified: Secondary | ICD-10-CM | POA: Diagnosis not present

## 2021-11-09 DIAGNOSIS — Z89612 Acquired absence of left leg above knee: Secondary | ICD-10-CM | POA: Diagnosis not present

## 2021-11-09 DIAGNOSIS — T3110 Burns involving 10-19% of body surface with 0% to 9% third degree burns: Secondary | ICD-10-CM | POA: Diagnosis not present

## 2021-11-14 DIAGNOSIS — Z89612 Acquired absence of left leg above knee: Secondary | ICD-10-CM | POA: Diagnosis not present

## 2021-11-14 DIAGNOSIS — Z4781 Encounter for orthopedic aftercare following surgical amputation: Secondary | ICD-10-CM | POA: Diagnosis not present

## 2021-11-16 DIAGNOSIS — D649 Anemia, unspecified: Secondary | ICD-10-CM | POA: Diagnosis not present

## 2021-11-16 DIAGNOSIS — E1169 Type 2 diabetes mellitus with other specified complication: Secondary | ICD-10-CM | POA: Diagnosis not present

## 2021-11-16 DIAGNOSIS — F5104 Psychophysiologic insomnia: Secondary | ICD-10-CM | POA: Diagnosis not present

## 2021-11-16 DIAGNOSIS — Z89612 Acquired absence of left leg above knee: Secondary | ICD-10-CM | POA: Diagnosis not present

## 2021-11-16 DIAGNOSIS — K219 Gastro-esophageal reflux disease without esophagitis: Secondary | ICD-10-CM | POA: Diagnosis not present

## 2021-11-16 DIAGNOSIS — N289 Disorder of kidney and ureter, unspecified: Secondary | ICD-10-CM | POA: Diagnosis not present

## 2021-11-16 DIAGNOSIS — G43909 Migraine, unspecified, not intractable, without status migrainosus: Secondary | ICD-10-CM | POA: Diagnosis not present

## 2021-11-16 DIAGNOSIS — F331 Major depressive disorder, recurrent, moderate: Secondary | ICD-10-CM | POA: Diagnosis not present

## 2021-11-16 DIAGNOSIS — I10 Essential (primary) hypertension: Secondary | ICD-10-CM | POA: Diagnosis not present

## 2021-11-16 DIAGNOSIS — E785 Hyperlipidemia, unspecified: Secondary | ICD-10-CM | POA: Diagnosis not present

## 2021-11-16 DIAGNOSIS — M25561 Pain in right knee: Secondary | ICD-10-CM | POA: Diagnosis not present

## 2021-11-16 DIAGNOSIS — M159 Polyosteoarthritis, unspecified: Secondary | ICD-10-CM | POA: Diagnosis not present

## 2021-11-16 DIAGNOSIS — G894 Chronic pain syndrome: Secondary | ICD-10-CM | POA: Diagnosis not present

## 2021-12-01 DIAGNOSIS — Z7409 Other reduced mobility: Secondary | ICD-10-CM | POA: Diagnosis not present

## 2021-12-01 DIAGNOSIS — Z89612 Acquired absence of left leg above knee: Secondary | ICD-10-CM | POA: Diagnosis not present

## 2021-12-04 DIAGNOSIS — N289 Disorder of kidney and ureter, unspecified: Secondary | ICD-10-CM | POA: Diagnosis not present

## 2021-12-04 DIAGNOSIS — F331 Major depressive disorder, recurrent, moderate: Secondary | ICD-10-CM | POA: Diagnosis not present

## 2021-12-04 DIAGNOSIS — M159 Polyosteoarthritis, unspecified: Secondary | ICD-10-CM | POA: Diagnosis not present

## 2021-12-04 DIAGNOSIS — E1169 Type 2 diabetes mellitus with other specified complication: Secondary | ICD-10-CM | POA: Diagnosis not present

## 2021-12-04 DIAGNOSIS — L309 Dermatitis, unspecified: Secondary | ICD-10-CM | POA: Diagnosis not present

## 2021-12-04 DIAGNOSIS — G43909 Migraine, unspecified, not intractable, without status migrainosus: Secondary | ICD-10-CM | POA: Diagnosis not present

## 2021-12-04 DIAGNOSIS — K219 Gastro-esophageal reflux disease without esophagitis: Secondary | ICD-10-CM | POA: Diagnosis not present

## 2021-12-04 DIAGNOSIS — D649 Anemia, unspecified: Secondary | ICD-10-CM | POA: Diagnosis not present

## 2021-12-04 DIAGNOSIS — F5104 Psychophysiologic insomnia: Secondary | ICD-10-CM | POA: Diagnosis not present

## 2021-12-04 DIAGNOSIS — E785 Hyperlipidemia, unspecified: Secondary | ICD-10-CM | POA: Diagnosis not present

## 2021-12-04 DIAGNOSIS — G894 Chronic pain syndrome: Secondary | ICD-10-CM | POA: Diagnosis not present

## 2021-12-04 DIAGNOSIS — K649 Unspecified hemorrhoids: Secondary | ICD-10-CM | POA: Diagnosis not present

## 2021-12-10 DIAGNOSIS — Z89612 Acquired absence of left leg above knee: Secondary | ICD-10-CM | POA: Diagnosis not present

## 2021-12-10 DIAGNOSIS — T3110 Burns involving 10-19% of body surface with 0% to 9% third degree burns: Secondary | ICD-10-CM | POA: Diagnosis not present

## 2021-12-27 DIAGNOSIS — Z89612 Acquired absence of left leg above knee: Secondary | ICD-10-CM | POA: Diagnosis not present

## 2022-01-01 DIAGNOSIS — K649 Unspecified hemorrhoids: Secondary | ICD-10-CM | POA: Diagnosis not present

## 2022-01-01 DIAGNOSIS — E785 Hyperlipidemia, unspecified: Secondary | ICD-10-CM | POA: Diagnosis not present

## 2022-01-01 DIAGNOSIS — F5104 Psychophysiologic insomnia: Secondary | ICD-10-CM | POA: Diagnosis not present

## 2022-01-01 DIAGNOSIS — E1169 Type 2 diabetes mellitus with other specified complication: Secondary | ICD-10-CM | POA: Diagnosis not present

## 2022-01-01 DIAGNOSIS — G43909 Migraine, unspecified, not intractable, without status migrainosus: Secondary | ICD-10-CM | POA: Diagnosis not present

## 2022-01-01 DIAGNOSIS — M159 Polyosteoarthritis, unspecified: Secondary | ICD-10-CM | POA: Diagnosis not present

## 2022-01-01 DIAGNOSIS — Z89612 Acquired absence of left leg above knee: Secondary | ICD-10-CM | POA: Diagnosis not present

## 2022-01-01 DIAGNOSIS — N289 Disorder of kidney and ureter, unspecified: Secondary | ICD-10-CM | POA: Diagnosis not present

## 2022-01-01 DIAGNOSIS — D649 Anemia, unspecified: Secondary | ICD-10-CM | POA: Diagnosis not present

## 2022-01-01 DIAGNOSIS — K219 Gastro-esophageal reflux disease without esophagitis: Secondary | ICD-10-CM | POA: Diagnosis not present

## 2022-01-01 DIAGNOSIS — G894 Chronic pain syndrome: Secondary | ICD-10-CM | POA: Diagnosis not present

## 2022-01-01 DIAGNOSIS — F331 Major depressive disorder, recurrent, moderate: Secondary | ICD-10-CM | POA: Diagnosis not present

## 2022-01-03 DIAGNOSIS — M949 Disorder of cartilage, unspecified: Secondary | ICD-10-CM | POA: Diagnosis not present

## 2022-01-03 DIAGNOSIS — M810 Age-related osteoporosis without current pathological fracture: Secondary | ICD-10-CM | POA: Diagnosis not present

## 2022-01-03 DIAGNOSIS — M899 Disorder of bone, unspecified: Secondary | ICD-10-CM | POA: Diagnosis not present

## 2022-01-03 DIAGNOSIS — Z89612 Acquired absence of left leg above knee: Secondary | ICD-10-CM | POA: Diagnosis not present

## 2022-01-03 DIAGNOSIS — Z8781 Personal history of (healed) traumatic fracture: Secondary | ICD-10-CM | POA: Diagnosis not present

## 2022-01-03 DIAGNOSIS — M1711 Unilateral primary osteoarthritis, right knee: Secondary | ICD-10-CM | POA: Diagnosis not present

## 2022-01-07 DIAGNOSIS — T3110 Burns involving 10-19% of body surface with 0% to 9% third degree burns: Secondary | ICD-10-CM | POA: Diagnosis not present

## 2022-01-07 DIAGNOSIS — Z89612 Acquired absence of left leg above knee: Secondary | ICD-10-CM | POA: Diagnosis not present

## 2022-01-26 DIAGNOSIS — I10 Essential (primary) hypertension: Secondary | ICD-10-CM | POA: Diagnosis not present

## 2022-01-26 DIAGNOSIS — E1169 Type 2 diabetes mellitus with other specified complication: Secondary | ICD-10-CM | POA: Diagnosis not present

## 2022-01-26 DIAGNOSIS — E785 Hyperlipidemia, unspecified: Secondary | ICD-10-CM | POA: Diagnosis not present

## 2022-01-29 DIAGNOSIS — N289 Disorder of kidney and ureter, unspecified: Secondary | ICD-10-CM | POA: Diagnosis not present

## 2022-01-29 DIAGNOSIS — K219 Gastro-esophageal reflux disease without esophagitis: Secondary | ICD-10-CM | POA: Diagnosis not present

## 2022-01-29 DIAGNOSIS — E1169 Type 2 diabetes mellitus with other specified complication: Secondary | ICD-10-CM | POA: Diagnosis not present

## 2022-01-29 DIAGNOSIS — G43909 Migraine, unspecified, not intractable, without status migrainosus: Secondary | ICD-10-CM | POA: Diagnosis not present

## 2022-01-29 DIAGNOSIS — F331 Major depressive disorder, recurrent, moderate: Secondary | ICD-10-CM | POA: Diagnosis not present

## 2022-01-29 DIAGNOSIS — E785 Hyperlipidemia, unspecified: Secondary | ICD-10-CM | POA: Diagnosis not present

## 2022-01-29 DIAGNOSIS — Z6827 Body mass index (BMI) 27.0-27.9, adult: Secondary | ICD-10-CM | POA: Diagnosis not present

## 2022-01-29 DIAGNOSIS — Z89612 Acquired absence of left leg above knee: Secondary | ICD-10-CM | POA: Diagnosis not present

## 2022-01-29 DIAGNOSIS — F5104 Psychophysiologic insomnia: Secondary | ICD-10-CM | POA: Diagnosis not present

## 2022-01-29 DIAGNOSIS — G894 Chronic pain syndrome: Secondary | ICD-10-CM | POA: Diagnosis not present

## 2022-01-29 DIAGNOSIS — K047 Periapical abscess without sinus: Secondary | ICD-10-CM | POA: Diagnosis not present

## 2022-01-31 DIAGNOSIS — Z89612 Acquired absence of left leg above knee: Secondary | ICD-10-CM | POA: Diagnosis not present

## 2022-02-07 DIAGNOSIS — T3110 Burns involving 10-19% of body surface with 0% to 9% third degree burns: Secondary | ICD-10-CM | POA: Diagnosis not present

## 2022-02-07 DIAGNOSIS — Z89612 Acquired absence of left leg above knee: Secondary | ICD-10-CM | POA: Diagnosis not present

## 2022-02-13 DIAGNOSIS — Z4781 Encounter for orthopedic aftercare following surgical amputation: Secondary | ICD-10-CM | POA: Diagnosis not present

## 2022-02-13 DIAGNOSIS — Z89612 Acquired absence of left leg above knee: Secondary | ICD-10-CM | POA: Diagnosis not present

## 2022-02-26 DIAGNOSIS — G894 Chronic pain syndrome: Secondary | ICD-10-CM | POA: Diagnosis not present

## 2022-02-26 DIAGNOSIS — Z89612 Acquired absence of left leg above knee: Secondary | ICD-10-CM | POA: Diagnosis not present

## 2022-02-26 DIAGNOSIS — Z6827 Body mass index (BMI) 27.0-27.9, adult: Secondary | ICD-10-CM | POA: Diagnosis not present

## 2022-02-26 DIAGNOSIS — K219 Gastro-esophageal reflux disease without esophagitis: Secondary | ICD-10-CM | POA: Diagnosis not present

## 2022-02-26 DIAGNOSIS — F5104 Psychophysiologic insomnia: Secondary | ICD-10-CM | POA: Diagnosis not present

## 2022-02-26 DIAGNOSIS — G43909 Migraine, unspecified, not intractable, without status migrainosus: Secondary | ICD-10-CM | POA: Diagnosis not present

## 2022-02-26 DIAGNOSIS — F331 Major depressive disorder, recurrent, moderate: Secondary | ICD-10-CM | POA: Diagnosis not present

## 2022-03-09 DIAGNOSIS — Z89612 Acquired absence of left leg above knee: Secondary | ICD-10-CM | POA: Diagnosis not present

## 2022-03-09 DIAGNOSIS — T3110 Burns involving 10-19% of body surface with 0% to 9% third degree burns: Secondary | ICD-10-CM | POA: Diagnosis not present

## 2022-03-12 DIAGNOSIS — F5104 Psychophysiologic insomnia: Secondary | ICD-10-CM | POA: Diagnosis not present

## 2022-03-12 DIAGNOSIS — E1169 Type 2 diabetes mellitus with other specified complication: Secondary | ICD-10-CM | POA: Diagnosis not present

## 2022-03-12 DIAGNOSIS — I10 Essential (primary) hypertension: Secondary | ICD-10-CM | POA: Diagnosis not present

## 2022-03-12 DIAGNOSIS — K219 Gastro-esophageal reflux disease without esophagitis: Secondary | ICD-10-CM | POA: Diagnosis not present

## 2022-03-12 DIAGNOSIS — G43909 Migraine, unspecified, not intractable, without status migrainosus: Secondary | ICD-10-CM | POA: Diagnosis not present

## 2022-03-12 DIAGNOSIS — F331 Major depressive disorder, recurrent, moderate: Secondary | ICD-10-CM | POA: Diagnosis not present

## 2022-03-12 DIAGNOSIS — Z6827 Body mass index (BMI) 27.0-27.9, adult: Secondary | ICD-10-CM | POA: Diagnosis not present

## 2022-03-12 DIAGNOSIS — L309 Dermatitis, unspecified: Secondary | ICD-10-CM | POA: Diagnosis not present

## 2022-03-12 DIAGNOSIS — E785 Hyperlipidemia, unspecified: Secondary | ICD-10-CM | POA: Diagnosis not present

## 2022-03-12 DIAGNOSIS — G894 Chronic pain syndrome: Secondary | ICD-10-CM | POA: Diagnosis not present

## 2022-03-19 DIAGNOSIS — H25813 Combined forms of age-related cataract, bilateral: Secondary | ICD-10-CM | POA: Diagnosis not present

## 2022-03-19 DIAGNOSIS — H52203 Unspecified astigmatism, bilateral: Secondary | ICD-10-CM | POA: Diagnosis not present

## 2022-03-19 DIAGNOSIS — H5213 Myopia, bilateral: Secondary | ICD-10-CM | POA: Diagnosis not present

## 2022-03-19 DIAGNOSIS — H524 Presbyopia: Secondary | ICD-10-CM | POA: Diagnosis not present

## 2022-03-19 DIAGNOSIS — Z87828 Personal history of other (healed) physical injury and trauma: Secondary | ICD-10-CM | POA: Diagnosis not present

## 2022-03-19 DIAGNOSIS — H1013 Acute atopic conjunctivitis, bilateral: Secondary | ICD-10-CM | POA: Diagnosis not present

## 2022-03-19 DIAGNOSIS — H11153 Pinguecula, bilateral: Secondary | ICD-10-CM | POA: Diagnosis not present

## 2022-03-23 DIAGNOSIS — M1711 Unilateral primary osteoarthritis, right knee: Secondary | ICD-10-CM | POA: Diagnosis not present

## 2022-03-28 DIAGNOSIS — H903 Sensorineural hearing loss, bilateral: Secondary | ICD-10-CM | POA: Diagnosis not present

## 2022-03-28 DIAGNOSIS — H9190 Unspecified hearing loss, unspecified ear: Secondary | ICD-10-CM | POA: Diagnosis not present

## 2022-03-30 DIAGNOSIS — F331 Major depressive disorder, recurrent, moderate: Secondary | ICD-10-CM | POA: Diagnosis not present

## 2022-03-30 DIAGNOSIS — F5104 Psychophysiologic insomnia: Secondary | ICD-10-CM | POA: Diagnosis not present

## 2022-03-30 DIAGNOSIS — G43909 Migraine, unspecified, not intractable, without status migrainosus: Secondary | ICD-10-CM | POA: Diagnosis not present

## 2022-03-30 DIAGNOSIS — G894 Chronic pain syndrome: Secondary | ICD-10-CM | POA: Diagnosis not present

## 2022-03-30 DIAGNOSIS — K219 Gastro-esophageal reflux disease without esophagitis: Secondary | ICD-10-CM | POA: Diagnosis not present

## 2022-03-30 DIAGNOSIS — E785 Hyperlipidemia, unspecified: Secondary | ICD-10-CM | POA: Diagnosis not present

## 2022-03-30 DIAGNOSIS — E1169 Type 2 diabetes mellitus with other specified complication: Secondary | ICD-10-CM | POA: Diagnosis not present

## 2022-03-30 DIAGNOSIS — Z6827 Body mass index (BMI) 27.0-27.9, adult: Secondary | ICD-10-CM | POA: Diagnosis not present

## 2022-04-02 DIAGNOSIS — Z885 Allergy status to narcotic agent status: Secondary | ICD-10-CM | POA: Diagnosis not present

## 2022-04-02 DIAGNOSIS — L910 Hypertrophic scar: Secondary | ICD-10-CM | POA: Diagnosis not present

## 2022-04-02 DIAGNOSIS — T24302D Burn of third degree of unspecified site of left lower limb, except ankle and foot, subsequent encounter: Secondary | ICD-10-CM | POA: Diagnosis not present

## 2022-04-02 DIAGNOSIS — T22332D Burn of third degree of left upper arm, subsequent encounter: Secondary | ICD-10-CM | POA: Diagnosis not present

## 2022-04-02 DIAGNOSIS — T300 Burn of unspecified body region, unspecified degree: Secondary | ICD-10-CM | POA: Diagnosis not present

## 2022-04-09 DIAGNOSIS — Z89612 Acquired absence of left leg above knee: Secondary | ICD-10-CM | POA: Diagnosis not present

## 2022-04-09 DIAGNOSIS — T3110 Burns involving 10-19% of body surface with 0% to 9% third degree burns: Secondary | ICD-10-CM | POA: Diagnosis not present

## 2022-04-11 DIAGNOSIS — Z4781 Encounter for orthopedic aftercare following surgical amputation: Secondary | ICD-10-CM | POA: Diagnosis not present

## 2022-04-11 DIAGNOSIS — Z9714 Presence of artificial left leg (complete) (partial): Secondary | ICD-10-CM | POA: Diagnosis not present

## 2022-04-11 DIAGNOSIS — Z89612 Acquired absence of left leg above knee: Secondary | ICD-10-CM | POA: Diagnosis not present

## 2022-04-27 DIAGNOSIS — F5104 Psychophysiologic insomnia: Secondary | ICD-10-CM | POA: Diagnosis not present

## 2022-04-27 DIAGNOSIS — E1169 Type 2 diabetes mellitus with other specified complication: Secondary | ICD-10-CM | POA: Diagnosis not present

## 2022-04-27 DIAGNOSIS — E785 Hyperlipidemia, unspecified: Secondary | ICD-10-CM | POA: Diagnosis not present

## 2022-04-27 DIAGNOSIS — G894 Chronic pain syndrome: Secondary | ICD-10-CM | POA: Diagnosis not present

## 2022-04-27 DIAGNOSIS — Z6827 Body mass index (BMI) 27.0-27.9, adult: Secondary | ICD-10-CM | POA: Diagnosis not present

## 2022-04-27 DIAGNOSIS — G43909 Migraine, unspecified, not intractable, without status migrainosus: Secondary | ICD-10-CM | POA: Diagnosis not present

## 2022-05-09 DIAGNOSIS — Z89612 Acquired absence of left leg above knee: Secondary | ICD-10-CM | POA: Diagnosis not present

## 2022-05-09 DIAGNOSIS — T3110 Burns involving 10-19% of body surface with 0% to 9% third degree burns: Secondary | ICD-10-CM | POA: Diagnosis not present

## 2022-05-25 DIAGNOSIS — M25561 Pain in right knee: Secondary | ICD-10-CM | POA: Diagnosis not present

## 2022-05-25 DIAGNOSIS — G894 Chronic pain syndrome: Secondary | ICD-10-CM | POA: Diagnosis not present

## 2022-05-25 DIAGNOSIS — E785 Hyperlipidemia, unspecified: Secondary | ICD-10-CM | POA: Diagnosis not present

## 2022-05-25 DIAGNOSIS — K649 Unspecified hemorrhoids: Secondary | ICD-10-CM | POA: Diagnosis not present

## 2022-05-25 DIAGNOSIS — Z89612 Acquired absence of left leg above knee: Secondary | ICD-10-CM | POA: Diagnosis not present

## 2022-05-25 DIAGNOSIS — K219 Gastro-esophageal reflux disease without esophagitis: Secondary | ICD-10-CM | POA: Diagnosis not present

## 2022-05-25 DIAGNOSIS — K047 Periapical abscess without sinus: Secondary | ICD-10-CM | POA: Diagnosis not present

## 2022-05-25 DIAGNOSIS — E1169 Type 2 diabetes mellitus with other specified complication: Secondary | ICD-10-CM | POA: Diagnosis not present

## 2022-05-25 DIAGNOSIS — G43909 Migraine, unspecified, not intractable, without status migrainosus: Secondary | ICD-10-CM | POA: Diagnosis not present

## 2022-05-25 DIAGNOSIS — T300 Burn of unspecified body region, unspecified degree: Secondary | ICD-10-CM | POA: Diagnosis not present

## 2022-05-25 DIAGNOSIS — F5104 Psychophysiologic insomnia: Secondary | ICD-10-CM | POA: Diagnosis not present

## 2022-05-25 DIAGNOSIS — F331 Major depressive disorder, recurrent, moderate: Secondary | ICD-10-CM | POA: Diagnosis not present

## 2022-06-07 DIAGNOSIS — G894 Chronic pain syndrome: Secondary | ICD-10-CM | POA: Diagnosis not present

## 2022-06-07 DIAGNOSIS — K219 Gastro-esophageal reflux disease without esophagitis: Secondary | ICD-10-CM | POA: Diagnosis not present

## 2022-06-07 DIAGNOSIS — E785 Hyperlipidemia, unspecified: Secondary | ICD-10-CM | POA: Diagnosis not present

## 2022-06-07 DIAGNOSIS — Z6827 Body mass index (BMI) 27.0-27.9, adult: Secondary | ICD-10-CM | POA: Diagnosis not present

## 2022-06-07 DIAGNOSIS — Z89612 Acquired absence of left leg above knee: Secondary | ICD-10-CM | POA: Diagnosis not present

## 2022-06-07 DIAGNOSIS — G43909 Migraine, unspecified, not intractable, without status migrainosus: Secondary | ICD-10-CM | POA: Diagnosis not present

## 2022-06-07 DIAGNOSIS — K649 Unspecified hemorrhoids: Secondary | ICD-10-CM | POA: Diagnosis not present

## 2022-06-07 DIAGNOSIS — N289 Disorder of kidney and ureter, unspecified: Secondary | ICD-10-CM | POA: Diagnosis not present

## 2022-06-07 DIAGNOSIS — E1169 Type 2 diabetes mellitus with other specified complication: Secondary | ICD-10-CM | POA: Diagnosis not present

## 2022-06-07 DIAGNOSIS — F5104 Psychophysiologic insomnia: Secondary | ICD-10-CM | POA: Diagnosis not present

## 2022-06-07 DIAGNOSIS — D649 Anemia, unspecified: Secondary | ICD-10-CM | POA: Diagnosis not present

## 2022-06-07 DIAGNOSIS — F331 Major depressive disorder, recurrent, moderate: Secondary | ICD-10-CM | POA: Diagnosis not present

## 2022-06-09 DIAGNOSIS — Z89612 Acquired absence of left leg above knee: Secondary | ICD-10-CM | POA: Diagnosis not present

## 2022-06-09 DIAGNOSIS — T3110 Burns involving 10-19% of body surface with 0% to 9% third degree burns: Secondary | ICD-10-CM | POA: Diagnosis not present

## 2022-07-05 DIAGNOSIS — E1169 Type 2 diabetes mellitus with other specified complication: Secondary | ICD-10-CM | POA: Diagnosis not present

## 2022-07-05 DIAGNOSIS — D649 Anemia, unspecified: Secondary | ICD-10-CM | POA: Diagnosis not present

## 2022-07-05 DIAGNOSIS — M25561 Pain in right knee: Secondary | ICD-10-CM | POA: Diagnosis not present

## 2022-07-05 DIAGNOSIS — F5104 Psychophysiologic insomnia: Secondary | ICD-10-CM | POA: Diagnosis not present

## 2022-07-05 DIAGNOSIS — I10 Essential (primary) hypertension: Secondary | ICD-10-CM | POA: Diagnosis not present

## 2022-07-05 DIAGNOSIS — Z89612 Acquired absence of left leg above knee: Secondary | ICD-10-CM | POA: Diagnosis not present

## 2022-07-05 DIAGNOSIS — F331 Major depressive disorder, recurrent, moderate: Secondary | ICD-10-CM | POA: Diagnosis not present

## 2022-07-05 DIAGNOSIS — M159 Polyosteoarthritis, unspecified: Secondary | ICD-10-CM | POA: Diagnosis not present

## 2022-07-05 DIAGNOSIS — N289 Disorder of kidney and ureter, unspecified: Secondary | ICD-10-CM | POA: Diagnosis not present

## 2022-07-05 DIAGNOSIS — E785 Hyperlipidemia, unspecified: Secondary | ICD-10-CM | POA: Diagnosis not present

## 2022-07-05 DIAGNOSIS — J309 Allergic rhinitis, unspecified: Secondary | ICD-10-CM | POA: Diagnosis not present

## 2022-07-05 DIAGNOSIS — G43909 Migraine, unspecified, not intractable, without status migrainosus: Secondary | ICD-10-CM | POA: Diagnosis not present

## 2022-07-05 DIAGNOSIS — K219 Gastro-esophageal reflux disease without esophagitis: Secondary | ICD-10-CM | POA: Diagnosis not present

## 2022-07-05 DIAGNOSIS — G894 Chronic pain syndrome: Secondary | ICD-10-CM | POA: Diagnosis not present

## 2022-07-05 DIAGNOSIS — R5382 Chronic fatigue, unspecified: Secondary | ICD-10-CM | POA: Diagnosis not present

## 2022-07-09 DIAGNOSIS — D649 Anemia, unspecified: Secondary | ICD-10-CM | POA: Diagnosis not present

## 2022-07-09 DIAGNOSIS — M159 Polyosteoarthritis, unspecified: Secondary | ICD-10-CM | POA: Diagnosis not present

## 2022-07-09 DIAGNOSIS — G894 Chronic pain syndrome: Secondary | ICD-10-CM | POA: Diagnosis not present

## 2022-07-09 DIAGNOSIS — E785 Hyperlipidemia, unspecified: Secondary | ICD-10-CM | POA: Diagnosis not present

## 2022-07-09 DIAGNOSIS — F331 Major depressive disorder, recurrent, moderate: Secondary | ICD-10-CM | POA: Diagnosis not present

## 2022-07-09 DIAGNOSIS — R110 Nausea: Secondary | ICD-10-CM | POA: Diagnosis not present

## 2022-07-09 DIAGNOSIS — F5104 Psychophysiologic insomnia: Secondary | ICD-10-CM | POA: Diagnosis not present

## 2022-07-09 DIAGNOSIS — K219 Gastro-esophageal reflux disease without esophagitis: Secondary | ICD-10-CM | POA: Diagnosis not present

## 2022-07-09 DIAGNOSIS — G43909 Migraine, unspecified, not intractable, without status migrainosus: Secondary | ICD-10-CM | POA: Diagnosis not present

## 2022-07-09 DIAGNOSIS — E1169 Type 2 diabetes mellitus with other specified complication: Secondary | ICD-10-CM | POA: Diagnosis not present

## 2022-07-09 DIAGNOSIS — Z89612 Acquired absence of left leg above knee: Secondary | ICD-10-CM | POA: Diagnosis not present

## 2022-07-09 DIAGNOSIS — N289 Disorder of kidney and ureter, unspecified: Secondary | ICD-10-CM | POA: Diagnosis not present

## 2022-07-10 DIAGNOSIS — Z89612 Acquired absence of left leg above knee: Secondary | ICD-10-CM | POA: Diagnosis not present

## 2022-07-10 DIAGNOSIS — T3110 Burns involving 10-19% of body surface with 0% to 9% third degree burns: Secondary | ICD-10-CM | POA: Diagnosis not present

## 2022-07-11 DIAGNOSIS — Z4781 Encounter for orthopedic aftercare following surgical amputation: Secondary | ICD-10-CM | POA: Diagnosis not present

## 2022-07-11 DIAGNOSIS — Z89612 Acquired absence of left leg above knee: Secondary | ICD-10-CM | POA: Diagnosis not present

## 2022-07-19 DIAGNOSIS — M159 Polyosteoarthritis, unspecified: Secondary | ICD-10-CM | POA: Diagnosis not present

## 2022-07-19 DIAGNOSIS — G43909 Migraine, unspecified, not intractable, without status migrainosus: Secondary | ICD-10-CM | POA: Diagnosis not present

## 2022-07-19 DIAGNOSIS — K219 Gastro-esophageal reflux disease without esophagitis: Secondary | ICD-10-CM | POA: Diagnosis not present

## 2022-07-19 DIAGNOSIS — Z89612 Acquired absence of left leg above knee: Secondary | ICD-10-CM | POA: Diagnosis not present

## 2022-07-19 DIAGNOSIS — E1169 Type 2 diabetes mellitus with other specified complication: Secondary | ICD-10-CM | POA: Diagnosis not present

## 2022-07-19 DIAGNOSIS — D649 Anemia, unspecified: Secondary | ICD-10-CM | POA: Diagnosis not present

## 2022-07-19 DIAGNOSIS — N289 Disorder of kidney and ureter, unspecified: Secondary | ICD-10-CM | POA: Diagnosis not present

## 2022-07-19 DIAGNOSIS — F5104 Psychophysiologic insomnia: Secondary | ICD-10-CM | POA: Diagnosis not present

## 2022-07-19 DIAGNOSIS — F331 Major depressive disorder, recurrent, moderate: Secondary | ICD-10-CM | POA: Diagnosis not present

## 2022-07-19 DIAGNOSIS — G894 Chronic pain syndrome: Secondary | ICD-10-CM | POA: Diagnosis not present

## 2022-07-19 DIAGNOSIS — E785 Hyperlipidemia, unspecified: Secondary | ICD-10-CM | POA: Diagnosis not present

## 2022-07-19 DIAGNOSIS — M25561 Pain in right knee: Secondary | ICD-10-CM | POA: Diagnosis not present

## 2022-07-30 DIAGNOSIS — Z89612 Acquired absence of left leg above knee: Secondary | ICD-10-CM | POA: Diagnosis not present

## 2022-07-30 DIAGNOSIS — M25561 Pain in right knee: Secondary | ICD-10-CM | POA: Diagnosis not present

## 2022-07-30 DIAGNOSIS — T18128A Food in esophagus causing other injury, initial encounter: Secondary | ICD-10-CM | POA: Diagnosis not present

## 2022-07-30 DIAGNOSIS — F5104 Psychophysiologic insomnia: Secondary | ICD-10-CM | POA: Diagnosis not present

## 2022-07-30 DIAGNOSIS — K219 Gastro-esophageal reflux disease without esophagitis: Secondary | ICD-10-CM | POA: Diagnosis not present

## 2022-07-30 DIAGNOSIS — R1110 Vomiting, unspecified: Secondary | ICD-10-CM | POA: Diagnosis not present

## 2022-07-30 DIAGNOSIS — E1169 Type 2 diabetes mellitus with other specified complication: Secondary | ICD-10-CM | POA: Diagnosis not present

## 2022-07-30 DIAGNOSIS — N289 Disorder of kidney and ureter, unspecified: Secondary | ICD-10-CM | POA: Diagnosis not present

## 2022-07-30 DIAGNOSIS — R1310 Dysphagia, unspecified: Secondary | ICD-10-CM | POA: Diagnosis not present

## 2022-07-30 DIAGNOSIS — M159 Polyosteoarthritis, unspecified: Secondary | ICD-10-CM | POA: Diagnosis not present

## 2022-07-30 DIAGNOSIS — D649 Anemia, unspecified: Secondary | ICD-10-CM | POA: Diagnosis not present

## 2022-07-30 DIAGNOSIS — F331 Major depressive disorder, recurrent, moderate: Secondary | ICD-10-CM | POA: Diagnosis not present

## 2022-07-30 DIAGNOSIS — E785 Hyperlipidemia, unspecified: Secondary | ICD-10-CM | POA: Diagnosis not present

## 2022-07-30 DIAGNOSIS — G43909 Migraine, unspecified, not intractable, without status migrainosus: Secondary | ICD-10-CM | POA: Diagnosis not present

## 2022-07-30 DIAGNOSIS — G894 Chronic pain syndrome: Secondary | ICD-10-CM | POA: Diagnosis not present

## 2022-08-01 DIAGNOSIS — F4310 Post-traumatic stress disorder, unspecified: Secondary | ICD-10-CM | POA: Diagnosis not present

## 2022-08-01 DIAGNOSIS — Z89612 Acquired absence of left leg above knee: Secondary | ICD-10-CM | POA: Diagnosis not present

## 2022-08-01 DIAGNOSIS — F329 Major depressive disorder, single episode, unspecified: Secondary | ICD-10-CM | POA: Diagnosis not present

## 2022-08-09 DIAGNOSIS — Z89612 Acquired absence of left leg above knee: Secondary | ICD-10-CM | POA: Diagnosis not present

## 2022-08-09 DIAGNOSIS — T3110 Burns involving 10-19% of body surface with 0% to 9% third degree burns: Secondary | ICD-10-CM | POA: Diagnosis not present

## 2022-08-13 DIAGNOSIS — Z89612 Acquired absence of left leg above knee: Secondary | ICD-10-CM | POA: Diagnosis not present

## 2022-08-13 DIAGNOSIS — Z789 Other specified health status: Secondary | ICD-10-CM | POA: Diagnosis not present

## 2022-08-13 DIAGNOSIS — R29898 Other symptoms and signs involving the musculoskeletal system: Secondary | ICD-10-CM | POA: Diagnosis not present

## 2022-08-13 DIAGNOSIS — Z7409 Other reduced mobility: Secondary | ICD-10-CM | POA: Diagnosis not present

## 2022-08-20 DIAGNOSIS — E785 Hyperlipidemia, unspecified: Secondary | ICD-10-CM | POA: Diagnosis not present

## 2022-08-20 DIAGNOSIS — D649 Anemia, unspecified: Secondary | ICD-10-CM | POA: Diagnosis not present

## 2022-08-20 DIAGNOSIS — M25561 Pain in right knee: Secondary | ICD-10-CM | POA: Diagnosis not present

## 2022-08-20 DIAGNOSIS — N289 Disorder of kidney and ureter, unspecified: Secondary | ICD-10-CM | POA: Diagnosis not present

## 2022-08-20 DIAGNOSIS — F331 Major depressive disorder, recurrent, moderate: Secondary | ICD-10-CM | POA: Diagnosis not present

## 2022-08-20 DIAGNOSIS — G894 Chronic pain syndrome: Secondary | ICD-10-CM | POA: Diagnosis not present

## 2022-08-20 DIAGNOSIS — Z89612 Acquired absence of left leg above knee: Secondary | ICD-10-CM | POA: Diagnosis not present

## 2022-08-20 DIAGNOSIS — G43909 Migraine, unspecified, not intractable, without status migrainosus: Secondary | ICD-10-CM | POA: Diagnosis not present

## 2022-08-20 DIAGNOSIS — K219 Gastro-esophageal reflux disease without esophagitis: Secondary | ICD-10-CM | POA: Diagnosis not present

## 2022-08-20 DIAGNOSIS — F5104 Psychophysiologic insomnia: Secondary | ICD-10-CM | POA: Diagnosis not present

## 2022-08-20 DIAGNOSIS — F411 Generalized anxiety disorder: Secondary | ICD-10-CM | POA: Diagnosis not present

## 2022-08-20 DIAGNOSIS — E1169 Type 2 diabetes mellitus with other specified complication: Secondary | ICD-10-CM | POA: Diagnosis not present

## 2022-08-21 DIAGNOSIS — K219 Gastro-esophageal reflux disease without esophagitis: Secondary | ICD-10-CM | POA: Diagnosis not present

## 2022-08-21 DIAGNOSIS — R1319 Other dysphagia: Secondary | ICD-10-CM | POA: Diagnosis not present

## 2022-08-24 DIAGNOSIS — K222 Esophageal obstruction: Secondary | ICD-10-CM | POA: Diagnosis not present

## 2022-08-24 DIAGNOSIS — R1310 Dysphagia, unspecified: Secondary | ICD-10-CM | POA: Diagnosis not present

## 2022-08-24 DIAGNOSIS — K449 Diaphragmatic hernia without obstruction or gangrene: Secondary | ICD-10-CM | POA: Diagnosis not present

## 2022-09-03 DIAGNOSIS — G43909 Migraine, unspecified, not intractable, without status migrainosus: Secondary | ICD-10-CM | POA: Diagnosis not present

## 2022-09-03 DIAGNOSIS — M25561 Pain in right knee: Secondary | ICD-10-CM | POA: Diagnosis not present

## 2022-09-03 DIAGNOSIS — G894 Chronic pain syndrome: Secondary | ICD-10-CM | POA: Diagnosis not present

## 2022-09-03 DIAGNOSIS — K219 Gastro-esophageal reflux disease without esophagitis: Secondary | ICD-10-CM | POA: Diagnosis not present

## 2022-09-03 DIAGNOSIS — E785 Hyperlipidemia, unspecified: Secondary | ICD-10-CM | POA: Diagnosis not present

## 2022-09-03 DIAGNOSIS — F5104 Psychophysiologic insomnia: Secondary | ICD-10-CM | POA: Diagnosis not present

## 2022-09-03 DIAGNOSIS — E1169 Type 2 diabetes mellitus with other specified complication: Secondary | ICD-10-CM | POA: Diagnosis not present

## 2022-09-03 DIAGNOSIS — F411 Generalized anxiety disorder: Secondary | ICD-10-CM | POA: Diagnosis not present

## 2022-09-03 DIAGNOSIS — Z89612 Acquired absence of left leg above knee: Secondary | ICD-10-CM | POA: Diagnosis not present

## 2022-09-03 DIAGNOSIS — J0100 Acute maxillary sinusitis, unspecified: Secondary | ICD-10-CM | POA: Diagnosis not present

## 2022-09-03 DIAGNOSIS — N289 Disorder of kidney and ureter, unspecified: Secondary | ICD-10-CM | POA: Diagnosis not present

## 2022-09-03 DIAGNOSIS — F331 Major depressive disorder, recurrent, moderate: Secondary | ICD-10-CM | POA: Diagnosis not present

## 2022-09-09 DIAGNOSIS — T3110 Burns involving 10-19% of body surface with 0% to 9% third degree burns: Secondary | ICD-10-CM | POA: Diagnosis not present

## 2022-09-09 DIAGNOSIS — Z89612 Acquired absence of left leg above knee: Secondary | ICD-10-CM | POA: Diagnosis not present

## 2022-09-17 DIAGNOSIS — E1169 Type 2 diabetes mellitus with other specified complication: Secondary | ICD-10-CM | POA: Diagnosis not present

## 2022-09-17 DIAGNOSIS — L309 Dermatitis, unspecified: Secondary | ICD-10-CM | POA: Diagnosis not present

## 2022-09-17 DIAGNOSIS — K219 Gastro-esophageal reflux disease without esophagitis: Secondary | ICD-10-CM | POA: Diagnosis not present

## 2022-09-17 DIAGNOSIS — E785 Hyperlipidemia, unspecified: Secondary | ICD-10-CM | POA: Diagnosis not present

## 2022-09-17 DIAGNOSIS — F331 Major depressive disorder, recurrent, moderate: Secondary | ICD-10-CM | POA: Diagnosis not present

## 2022-09-17 DIAGNOSIS — G43909 Migraine, unspecified, not intractable, without status migrainosus: Secondary | ICD-10-CM | POA: Diagnosis not present

## 2022-09-17 DIAGNOSIS — M25561 Pain in right knee: Secondary | ICD-10-CM | POA: Diagnosis not present

## 2022-09-17 DIAGNOSIS — J0100 Acute maxillary sinusitis, unspecified: Secondary | ICD-10-CM | POA: Diagnosis not present

## 2022-09-17 DIAGNOSIS — F411 Generalized anxiety disorder: Secondary | ICD-10-CM | POA: Diagnosis not present

## 2022-09-17 DIAGNOSIS — F5104 Psychophysiologic insomnia: Secondary | ICD-10-CM | POA: Diagnosis not present

## 2022-09-17 DIAGNOSIS — Z89612 Acquired absence of left leg above knee: Secondary | ICD-10-CM | POA: Diagnosis not present

## 2022-09-17 DIAGNOSIS — G894 Chronic pain syndrome: Secondary | ICD-10-CM | POA: Diagnosis not present

## 2022-10-01 DIAGNOSIS — Z23 Encounter for immunization: Secondary | ICD-10-CM | POA: Diagnosis not present

## 2022-10-01 DIAGNOSIS — G43909 Migraine, unspecified, not intractable, without status migrainosus: Secondary | ICD-10-CM | POA: Diagnosis not present

## 2022-10-01 DIAGNOSIS — E1169 Type 2 diabetes mellitus with other specified complication: Secondary | ICD-10-CM | POA: Diagnosis not present

## 2022-10-01 DIAGNOSIS — M25561 Pain in right knee: Secondary | ICD-10-CM | POA: Diagnosis not present

## 2022-10-01 DIAGNOSIS — E785 Hyperlipidemia, unspecified: Secondary | ICD-10-CM | POA: Diagnosis not present

## 2022-10-01 DIAGNOSIS — J069 Acute upper respiratory infection, unspecified: Secondary | ICD-10-CM | POA: Diagnosis not present

## 2022-10-01 DIAGNOSIS — F331 Major depressive disorder, recurrent, moderate: Secondary | ICD-10-CM | POA: Diagnosis not present

## 2022-10-01 DIAGNOSIS — I10 Essential (primary) hypertension: Secondary | ICD-10-CM | POA: Diagnosis not present

## 2022-10-01 DIAGNOSIS — F5104 Psychophysiologic insomnia: Secondary | ICD-10-CM | POA: Diagnosis not present

## 2022-10-01 DIAGNOSIS — G894 Chronic pain syndrome: Secondary | ICD-10-CM | POA: Diagnosis not present

## 2022-10-01 DIAGNOSIS — K219 Gastro-esophageal reflux disease without esophagitis: Secondary | ICD-10-CM | POA: Diagnosis not present

## 2022-10-01 DIAGNOSIS — F411 Generalized anxiety disorder: Secondary | ICD-10-CM | POA: Diagnosis not present

## 2022-10-01 DIAGNOSIS — Z89612 Acquired absence of left leg above knee: Secondary | ICD-10-CM | POA: Diagnosis not present

## 2022-10-08 DIAGNOSIS — E1169 Type 2 diabetes mellitus with other specified complication: Secondary | ICD-10-CM | POA: Diagnosis not present

## 2022-10-08 DIAGNOSIS — F5104 Psychophysiologic insomnia: Secondary | ICD-10-CM | POA: Diagnosis not present

## 2022-10-08 DIAGNOSIS — K219 Gastro-esophageal reflux disease without esophagitis: Secondary | ICD-10-CM | POA: Diagnosis not present

## 2022-10-08 DIAGNOSIS — J069 Acute upper respiratory infection, unspecified: Secondary | ICD-10-CM | POA: Diagnosis not present

## 2022-10-08 DIAGNOSIS — E785 Hyperlipidemia, unspecified: Secondary | ICD-10-CM | POA: Diagnosis not present

## 2022-10-08 DIAGNOSIS — G43909 Migraine, unspecified, not intractable, without status migrainosus: Secondary | ICD-10-CM | POA: Diagnosis not present

## 2022-10-08 DIAGNOSIS — F411 Generalized anxiety disorder: Secondary | ICD-10-CM | POA: Diagnosis not present

## 2022-10-08 DIAGNOSIS — N289 Disorder of kidney and ureter, unspecified: Secondary | ICD-10-CM | POA: Diagnosis not present

## 2022-10-08 DIAGNOSIS — F331 Major depressive disorder, recurrent, moderate: Secondary | ICD-10-CM | POA: Diagnosis not present

## 2022-10-08 DIAGNOSIS — G894 Chronic pain syndrome: Secondary | ICD-10-CM | POA: Diagnosis not present

## 2022-10-08 DIAGNOSIS — Z89612 Acquired absence of left leg above knee: Secondary | ICD-10-CM | POA: Diagnosis not present

## 2022-10-08 DIAGNOSIS — I10 Essential (primary) hypertension: Secondary | ICD-10-CM | POA: Diagnosis not present

## 2022-10-09 DIAGNOSIS — T3110 Burns involving 10-19% of body surface with 0% to 9% third degree burns: Secondary | ICD-10-CM | POA: Diagnosis not present

## 2022-10-09 DIAGNOSIS — Z89612 Acquired absence of left leg above knee: Secondary | ICD-10-CM | POA: Diagnosis not present

## 2022-10-15 DIAGNOSIS — F411 Generalized anxiety disorder: Secondary | ICD-10-CM | POA: Diagnosis not present

## 2022-10-15 DIAGNOSIS — E1169 Type 2 diabetes mellitus with other specified complication: Secondary | ICD-10-CM | POA: Diagnosis not present

## 2022-10-15 DIAGNOSIS — E785 Hyperlipidemia, unspecified: Secondary | ICD-10-CM | POA: Diagnosis not present

## 2022-10-15 DIAGNOSIS — G43909 Migraine, unspecified, not intractable, without status migrainosus: Secondary | ICD-10-CM | POA: Diagnosis not present

## 2022-10-15 DIAGNOSIS — F5104 Psychophysiologic insomnia: Secondary | ICD-10-CM | POA: Diagnosis not present

## 2022-10-15 DIAGNOSIS — J069 Acute upper respiratory infection, unspecified: Secondary | ICD-10-CM | POA: Diagnosis not present

## 2022-10-15 DIAGNOSIS — Z89612 Acquired absence of left leg above knee: Secondary | ICD-10-CM | POA: Diagnosis not present

## 2022-10-15 DIAGNOSIS — N289 Disorder of kidney and ureter, unspecified: Secondary | ICD-10-CM | POA: Diagnosis not present

## 2022-10-15 DIAGNOSIS — F331 Major depressive disorder, recurrent, moderate: Secondary | ICD-10-CM | POA: Diagnosis not present

## 2022-10-15 DIAGNOSIS — K219 Gastro-esophageal reflux disease without esophagitis: Secondary | ICD-10-CM | POA: Diagnosis not present

## 2022-10-15 DIAGNOSIS — G894 Chronic pain syndrome: Secondary | ICD-10-CM | POA: Diagnosis not present

## 2022-10-15 DIAGNOSIS — I10 Essential (primary) hypertension: Secondary | ICD-10-CM | POA: Diagnosis not present

## 2022-10-16 DIAGNOSIS — Z89612 Acquired absence of left leg above knee: Secondary | ICD-10-CM | POA: Diagnosis not present

## 2022-10-16 DIAGNOSIS — M1711 Unilateral primary osteoarthritis, right knee: Secondary | ICD-10-CM | POA: Diagnosis not present

## 2022-10-16 DIAGNOSIS — Z7409 Other reduced mobility: Secondary | ICD-10-CM | POA: Diagnosis not present

## 2022-10-24 DIAGNOSIS — Z89612 Acquired absence of left leg above knee: Secondary | ICD-10-CM | POA: Diagnosis not present

## 2022-10-24 DIAGNOSIS — T8789 Other complications of amputation stump: Secondary | ICD-10-CM | POA: Diagnosis not present

## 2022-11-08 DIAGNOSIS — Z89612 Acquired absence of left leg above knee: Secondary | ICD-10-CM | POA: Diagnosis not present

## 2022-11-12 DIAGNOSIS — K649 Unspecified hemorrhoids: Secondary | ICD-10-CM | POA: Diagnosis not present

## 2022-11-12 DIAGNOSIS — F411 Generalized anxiety disorder: Secondary | ICD-10-CM | POA: Diagnosis not present

## 2022-11-12 DIAGNOSIS — E1169 Type 2 diabetes mellitus with other specified complication: Secondary | ICD-10-CM | POA: Diagnosis not present

## 2022-11-12 DIAGNOSIS — I10 Essential (primary) hypertension: Secondary | ICD-10-CM | POA: Diagnosis not present

## 2022-11-12 DIAGNOSIS — G43909 Migraine, unspecified, not intractable, without status migrainosus: Secondary | ICD-10-CM | POA: Diagnosis not present

## 2022-11-12 DIAGNOSIS — Z89612 Acquired absence of left leg above knee: Secondary | ICD-10-CM | POA: Diagnosis not present

## 2022-11-12 DIAGNOSIS — K219 Gastro-esophageal reflux disease without esophagitis: Secondary | ICD-10-CM | POA: Diagnosis not present

## 2022-11-12 DIAGNOSIS — F331 Major depressive disorder, recurrent, moderate: Secondary | ICD-10-CM | POA: Diagnosis not present

## 2022-11-12 DIAGNOSIS — J069 Acute upper respiratory infection, unspecified: Secondary | ICD-10-CM | POA: Diagnosis not present

## 2022-11-12 DIAGNOSIS — G894 Chronic pain syndrome: Secondary | ICD-10-CM | POA: Diagnosis not present

## 2022-11-12 DIAGNOSIS — F5104 Psychophysiologic insomnia: Secondary | ICD-10-CM | POA: Diagnosis not present

## 2022-11-12 DIAGNOSIS — E785 Hyperlipidemia, unspecified: Secondary | ICD-10-CM | POA: Diagnosis not present

## 2022-11-26 DIAGNOSIS — F331 Major depressive disorder, recurrent, moderate: Secondary | ICD-10-CM | POA: Diagnosis not present

## 2022-11-26 DIAGNOSIS — E785 Hyperlipidemia, unspecified: Secondary | ICD-10-CM | POA: Diagnosis not present

## 2022-11-26 DIAGNOSIS — G894 Chronic pain syndrome: Secondary | ICD-10-CM | POA: Diagnosis not present

## 2022-11-26 DIAGNOSIS — K649 Unspecified hemorrhoids: Secondary | ICD-10-CM | POA: Diagnosis not present

## 2022-11-26 DIAGNOSIS — I10 Essential (primary) hypertension: Secondary | ICD-10-CM | POA: Diagnosis not present

## 2022-11-26 DIAGNOSIS — J0100 Acute maxillary sinusitis, unspecified: Secondary | ICD-10-CM | POA: Diagnosis not present

## 2022-11-26 DIAGNOSIS — Z89612 Acquired absence of left leg above knee: Secondary | ICD-10-CM | POA: Diagnosis not present

## 2022-11-26 DIAGNOSIS — E1169 Type 2 diabetes mellitus with other specified complication: Secondary | ICD-10-CM | POA: Diagnosis not present

## 2022-11-26 DIAGNOSIS — K219 Gastro-esophageal reflux disease without esophagitis: Secondary | ICD-10-CM | POA: Diagnosis not present

## 2022-11-26 DIAGNOSIS — G43909 Migraine, unspecified, not intractable, without status migrainosus: Secondary | ICD-10-CM | POA: Diagnosis not present

## 2022-11-26 DIAGNOSIS — F5104 Psychophysiologic insomnia: Secondary | ICD-10-CM | POA: Diagnosis not present

## 2022-11-26 DIAGNOSIS — F411 Generalized anxiety disorder: Secondary | ICD-10-CM | POA: Diagnosis not present

## 2022-12-10 DIAGNOSIS — E1169 Type 2 diabetes mellitus with other specified complication: Secondary | ICD-10-CM | POA: Diagnosis not present

## 2022-12-10 DIAGNOSIS — I10 Essential (primary) hypertension: Secondary | ICD-10-CM | POA: Diagnosis not present

## 2022-12-10 DIAGNOSIS — G43909 Migraine, unspecified, not intractable, without status migrainosus: Secondary | ICD-10-CM | POA: Diagnosis not present

## 2022-12-10 DIAGNOSIS — F411 Generalized anxiety disorder: Secondary | ICD-10-CM | POA: Diagnosis not present

## 2022-12-10 DIAGNOSIS — F331 Major depressive disorder, recurrent, moderate: Secondary | ICD-10-CM | POA: Diagnosis not present

## 2022-12-10 DIAGNOSIS — G894 Chronic pain syndrome: Secondary | ICD-10-CM | POA: Diagnosis not present

## 2022-12-10 DIAGNOSIS — E785 Hyperlipidemia, unspecified: Secondary | ICD-10-CM | POA: Diagnosis not present

## 2022-12-10 DIAGNOSIS — Z89612 Acquired absence of left leg above knee: Secondary | ICD-10-CM | POA: Diagnosis not present

## 2022-12-10 DIAGNOSIS — K649 Unspecified hemorrhoids: Secondary | ICD-10-CM | POA: Diagnosis not present

## 2022-12-10 DIAGNOSIS — N289 Disorder of kidney and ureter, unspecified: Secondary | ICD-10-CM | POA: Diagnosis not present

## 2022-12-10 DIAGNOSIS — K219 Gastro-esophageal reflux disease without esophagitis: Secondary | ICD-10-CM | POA: Diagnosis not present

## 2022-12-10 DIAGNOSIS — F5104 Psychophysiologic insomnia: Secondary | ICD-10-CM | POA: Diagnosis not present

## 2023-03-11 ENCOUNTER — Ambulatory Visit (HOSPITAL_COMMUNITY): Payer: Self-pay | Admitting: Psychiatry

## 2023-03-29 ENCOUNTER — Ambulatory Visit (HOSPITAL_COMMUNITY): Payer: Self-pay | Admitting: Clinical

## 2023-05-03 ENCOUNTER — Ambulatory Visit (HOSPITAL_COMMUNITY): Payer: PPO | Admitting: Clinical

## 2023-05-03 ENCOUNTER — Encounter (HOSPITAL_COMMUNITY): Payer: Self-pay

## 2023-06-10 ENCOUNTER — Encounter (HOSPITAL_COMMUNITY): Payer: Self-pay | Admitting: Clinical

## 2023-06-10 ENCOUNTER — Ambulatory Visit (INDEPENDENT_AMBULATORY_CARE_PROVIDER_SITE_OTHER): Payer: PPO | Admitting: Clinical

## 2023-06-10 ENCOUNTER — Telehealth (HOSPITAL_COMMUNITY): Payer: Self-pay | Admitting: Clinical

## 2023-06-10 DIAGNOSIS — Z91199 Patient's noncompliance with other medical treatment and regimen due to unspecified reason: Secondary | ICD-10-CM

## 2023-06-10 NOTE — Progress Notes (Signed)
Patient ID: Henry Hason [place]., male   DOB: 04-03-1966, 57 y.o.   MRN: [id number]  Therapy Progress Note  Patient had an appointment scheduled with therapist on 06/10/2023  at 2;00pm for a Comprehensive Clinical Assessment to begin in therapy services.  He previously had new patient appointments on 03/29/2023 and 05/03/2023 for which he did not show up.  Today, he still did not arrive for the session at 2:20pm, so was considered a no show.     Encounter Diagnosis  Name Primary?   No-show for appointment Yes     Ambrose Mantle, LCSW 06/10/2023, 2:32 PM

## 2023-06-10 NOTE — Telephone Encounter (Signed)
This patient has been dismissed due to non compliance and has missed 3 consecutive appointments. Patient has been mailed certified letter. Certified mail number is [phone]

## 2023-06-12 ENCOUNTER — Ambulatory Visit (HOSPITAL_COMMUNITY): Payer: PPO | Admitting: Clinical
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
